# Patient Record
Sex: Male | Born: 1970 | Race: White | Hispanic: No | Marital: Married | Smoking: Current every day smoker
Health system: Southern US, Community
[De-identification: ages and names within clinical notes are randomized; demographics above are authoritative.]

## PROBLEM LIST (undated history)

## (undated) DIAGNOSIS — N433 Hydrocele, unspecified: Secondary | ICD-10-CM

## (undated) HISTORY — DX: Hydrocele, unspecified: N43.3

## (undated) HISTORY — PX: SHOULDER ARTHROSCOPY W/ SUPERIOR LABRAL ANTERIOR POSTERIOR LESION REPAIR: SHX2402

## (undated) HISTORY — PX: BICEPS TENDON REPAIR: SHX566

## (undated) HISTORY — PX: ROTATOR CUFF REPAIR: SHX139

## (undated) HISTORY — PX: SHOULDER SURGERY: SHX246

---

## 2003-01-24 ENCOUNTER — Encounter (INDEPENDENT_AMBULATORY_CARE_PROVIDER_SITE_OTHER): Payer: Self-pay | Admitting: Specialist

## 2003-01-24 ENCOUNTER — Ambulatory Visit (HOSPITAL_COMMUNITY): Admission: RE | Admit: 2003-01-24 | Discharge: 2003-01-24 | Payer: Self-pay | Admitting: General Surgery

## 2004-09-11 ENCOUNTER — Ambulatory Visit (HOSPITAL_COMMUNITY)
Admission: RE | Admit: 2004-09-11 | Discharge: 2004-09-11 | Payer: Self-pay | Admitting: Physical Medicine and Rehabilitation

## 2008-04-01 ENCOUNTER — Encounter: Admission: RE | Admit: 2008-04-01 | Discharge: 2008-04-29 | Payer: Self-pay | Admitting: Family Medicine

## 2009-08-26 ENCOUNTER — Encounter: Admission: RE | Admit: 2009-08-26 | Discharge: 2009-08-26 | Payer: Self-pay | Admitting: Family Medicine

## 2010-03-30 ENCOUNTER — Ambulatory Visit: Payer: Self-pay | Admitting: Family Medicine

## 2010-03-30 DIAGNOSIS — R222 Localized swelling, mass and lump, trunk: Secondary | ICD-10-CM

## 2010-03-30 DIAGNOSIS — N433 Hydrocele, unspecified: Secondary | ICD-10-CM

## 2010-03-30 HISTORY — DX: Hydrocele, unspecified: N43.3

## 2010-04-06 ENCOUNTER — Telehealth: Payer: Self-pay | Admitting: Family Medicine

## 2010-04-07 ENCOUNTER — Telehealth: Payer: Self-pay | Admitting: Family Medicine

## 2010-04-10 ENCOUNTER — Encounter: Payer: Self-pay | Admitting: Family Medicine

## 2010-04-17 ENCOUNTER — Telehealth (INDEPENDENT_AMBULATORY_CARE_PROVIDER_SITE_OTHER): Payer: Self-pay | Admitting: *Deleted

## 2010-05-19 ENCOUNTER — Encounter: Payer: Self-pay | Admitting: Family Medicine

## 2010-08-18 NOTE — Letter (Signed)
[facility]: [facility]   Imported By: Lanelle Bal 05/27/2010 10:42:57  _____________________________________________________________________  External Attachment:    Type:   Image     Comment:   External Document

## 2010-08-18 NOTE — Progress Notes (Signed)
Summary: urologist  Phone Note Call from Patient   Caller: Patient Call For: Nani Gasser MD Summary of Call: pt called and wanted [place] to know that he did not have a good visit with the urologist we referred him to. Pt states he did not listen to what he was saying and the doctor "got frustrated with him because his answers where too long." Pt just wanted [place] to make a note of this Initial call taken by: Avon Gully CMA, Duncan Dull),  April 17, 2010 10:05 AM

## 2010-08-18 NOTE — Consult Note (Signed)
[facility]: [facility]   Imported By: Lanelle Bal 04/20/2010 11:11:26  _____________________________________________________________________  External Attachment:    Type:   Image     Comment:   External Document

## 2010-08-18 NOTE — Progress Notes (Signed)
Summary: Pain meds  Phone Note Call from Patient   Caller: Patient [phone] Summary of Call: Pt calls back again requesting pain meds be sent until he is able to see pain doctor. Initial call taken by: Payton Spark CMA,  April 07, 2010 12:23 PM  Follow-up for Phone Call        Needs records faxed from ortho.  Follow-up by: Nani Gasser MD,  April 07, 2010 12:34 PM  Additional Follow-up for Phone Call Additional follow up Details #1::        called and spoke with pt and told him again that he needs to get ortho rec to [place]. Pt states he will "get his attorney to fax over the records to [place]".  Additional Follow-up by: Avon Gully CMA, Duncan Dull),  April 07, 2010 1:13 PM     Appended Document: Pain meds I looke him up in the registry adn called his ortho office. I will approv him for 40 tabs to last 30 days until see pain managment. We can schedule the appt for he can schedule.  September 21, 20114:32 PM Metheney MD, Santina Evans 04/09/2010 @ 8:00am- Pt notified and med faxed to pharmacy. kJ LPN  Prescriptions: Prescriptions: HYDROCODONE-IBUPROFEN 7.5-200 MG TABS (HYDROCODONE-IBUPROFEN) take one tablet three times a day  #40 x 0   Entered and Authorized by:   Nani Gasser MD   Signed by:   Nani Gasser MD on 04/08/2010   Method used:   Printed then faxed to ...       [facility]* (retail)       [address] [place], [place]  [address]       Ph: [phone]       Fax: [phone]   RxID:   [id number]

## 2010-08-18 NOTE — Assessment & Plan Note (Signed)
Summary: NOV: Lump, right hydrocele   Vital Signs:  Patient profile:   40 year old male Height:      67 inches Weight:      150 pounds BMI:     23.58 Pulse rate:   67 / minute BP sitting:   139 / 76  (right arm) Cuff size:   regular  Vitals Entered By: Avon Gully CMA, Duncan Dull) (March 30, 2010 2:55 PM) CC: NP est care   CC:  NP est care.  History of Present Illness: Has been medically released from his shoulder surgeries. Previous MD left the practice so trying ot find a new MD.  Still has chronic pain and uses 3 hydrocodone a day.    Right pea sized lump on the right abdomen.  Occ iritated based on his movement. No change in size.  Noticed it about 6 months ago.  Otherwise doesn't bother him.   As I was leaving the room he mentioned that he has water on his right testicle for several months. Says had an [place] that showed minminal fluid but that it has gotten worse since then.   Habits & Providers  Alcohol-Tobacco-Diet     Tobacco Status: current     Cigarette Packs/Day: 1.0  Exercise-Depression-Behavior     Does Patient Exercise: no     STD Risk: never     Drug Use: no     Seat Belt Use: always  Current Medications (verified): 1)  Hydrocodone-Ibuprofen 7.5-200 Mg Tabs (Hydrocodone-Ibuprofen) .... Take One Tablet Three Times A Day  Allergies (verified): 1)  Ultram (Tramadol Hcl)  Comments:  Nurse/Medical Assistant: The patient's medications and allergies were reviewed with the patient and were updated in the Medication and Allergy Lists. Avon Gully CMA, Duncan Dull) (March 30, 2010 2:57 PM)  Past History:  Past Medical History: Chronic shoulder pain  Past Surgical History: Left shoulder SLAP repair and tendon repar 01/2009 - Dr. Dorma Russell at [facility]  Labrum repair right shoulder 03/2009 - Dr. Bayard Beaver.  Mass removed from left hand LN removed from Left axilla Steroid injection into 2 discs in his back Right and left  hernia repair at age 80.     Family History: None  Social History: Married to [place] with a son.  Current Smoker Alcohol use-yes Drug use-no Regular exercise-no 4 caffeinated beverages a day.  Smoking Status:  current Packs/Day:  1.0 STD Risk:  never Drug Use:  no Seat Belt Use:  always Does Patient Exercise:  no  Review of Systems       No fever/sweats/weakness, unexplained weight loss/gain.  No vison changes.  No difficulty hearing/ringing in ears, hay fever/allergies.  No chest pain/discomfort, palpitations.  No Br lump/nipple discharge.  No cough/wheeze.  No blood in BM, nausea/vomiting/diarrhea.  No nighttime urination, leaking urine, unusual vaginal bleeding, discharge (penis or vagina).  No muscle/joint pain. No rash, change in mole.  No HA, memory loss.  No anxiety, + sleep d/o, no depression.  No easy bruising/bleeding, unexplained lump   Physical Exam  General:  Well-developed,well-nourished,in no acute distress; alert,appropriate and cooperative throughout examination Head:  Normocephalic and atraumatic without obvious abnormalities. No apparent alopecia or balding. Eyes:  No corneal or conjunctival inflammation noted. EOMI. Perrla.  Abdomen:  soft and non-tender.  Not able to palpate the lesion on her right upper abdomen.  .     Impression & Recommendations:  Problem # 1:  SWELLING, MASS, OR LUMP IN CHEST (ICD-786.6) Unable to palpate the lesion. Encouraged him to let  mke know if changing in size and not to rub at it as it could inflame the area.    Problem # 2:  HYDROCELE, RIGHT (ICD-603.9) Will refer to Urology for further evaluation.  Orders: Urology Referral (Urology)  Complete Medication List: 1)  Hydrocodone-ibuprofen 7.5-200 Mg Tabs (Hydrocodone-ibuprofen) .... Take one tablet three times a day  Contraindications/Deferment of Procedures/Staging:    Test/Procedure: FLU VAX    Reason for deferment: patient declined

## 2010-08-18 NOTE — Progress Notes (Signed)
Summary: pain meds  Phone Note Call from Patient   Caller: Patient Call For: Nani Gasser MD Summary of Call: Pt called and states  wants refill on his pain medicines until he can get in with a pain managament doc.does not have an appt set yet Initial call taken by: Avon Gully CMA, Duncan Dull),  April 06, 2010 4:35 PM  Follow-up for Phone Call        I need notes from ortho who was previously rx medications. He Can have them faxed to our office.  Follow-up by: Nani Gasser MD,  April 06, 2010 5:19 PM  Additional Follow-up for Phone Call Additional follow up Details #1::        called and left above message on pt's vm Additional Follow-up by: Avon Gully CMA, Duncan Dull),  April 07, 2010 8:58 AM

## 2010-10-23 IMAGING — US US SCROTUM
1 series · 14 of 25 positions shown · non-contrast
Comparison: None.

CLINICAL DATA: Palpable right scrotal mass.

ULTRASOUND OF SCROTUM 08/26/2009:
TECHNIQUE: Complete ultrasound examination of the testicles,
epididymis, and other scrotal structures was performed.

[Series 1: us scrotum · 0.08mm/px · 14 of 34 slices shown]
[im 1/34]
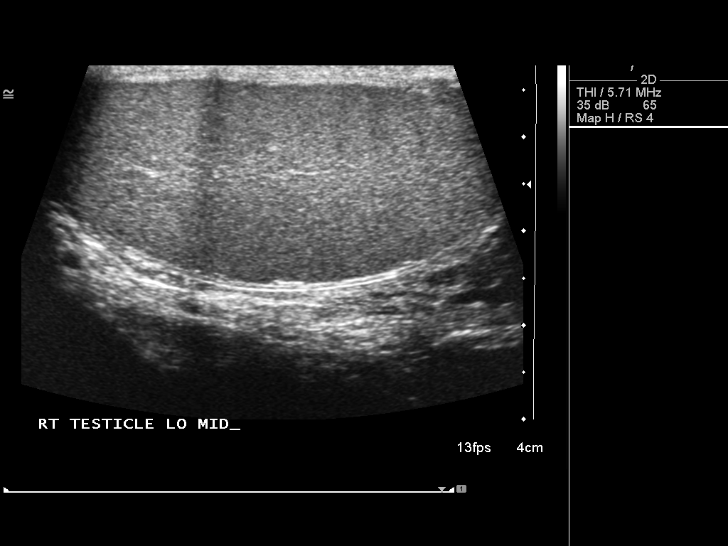
[im 3/34]
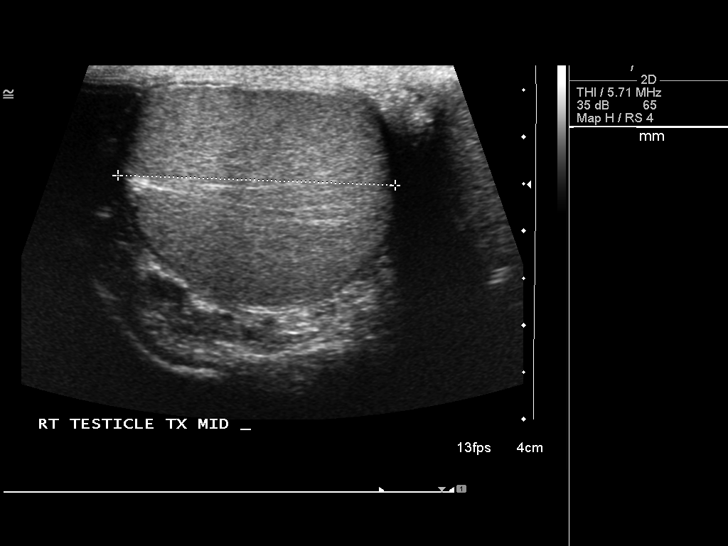
[im 6/34]
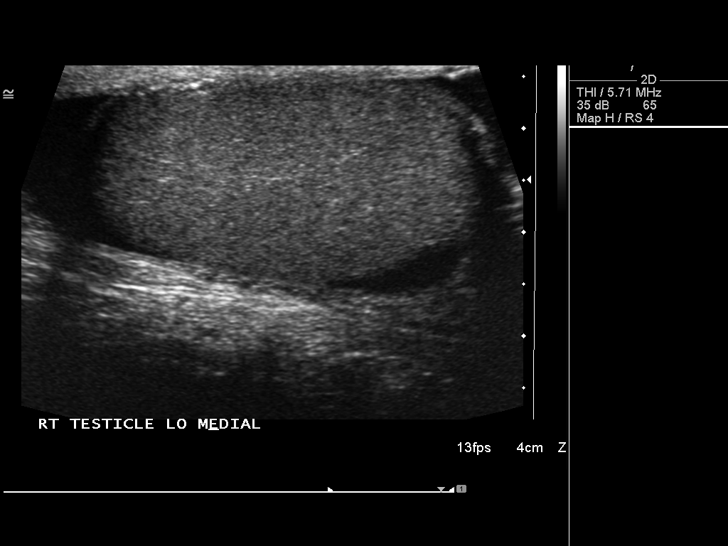
[im 9/34]
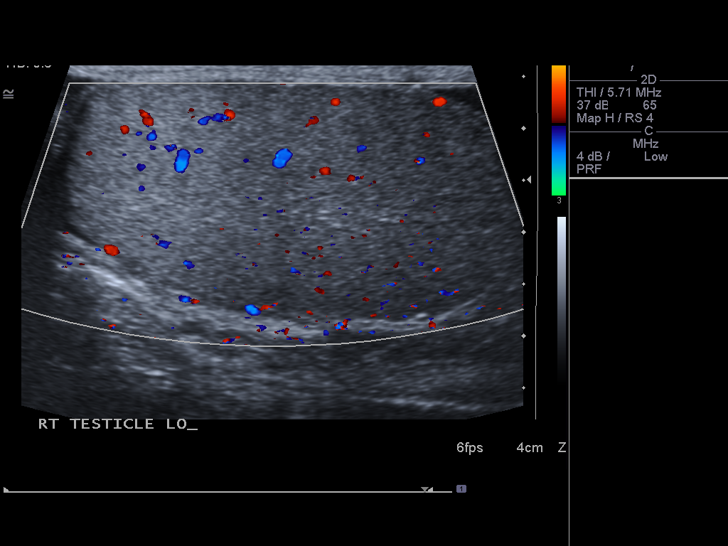
[im 12/34]
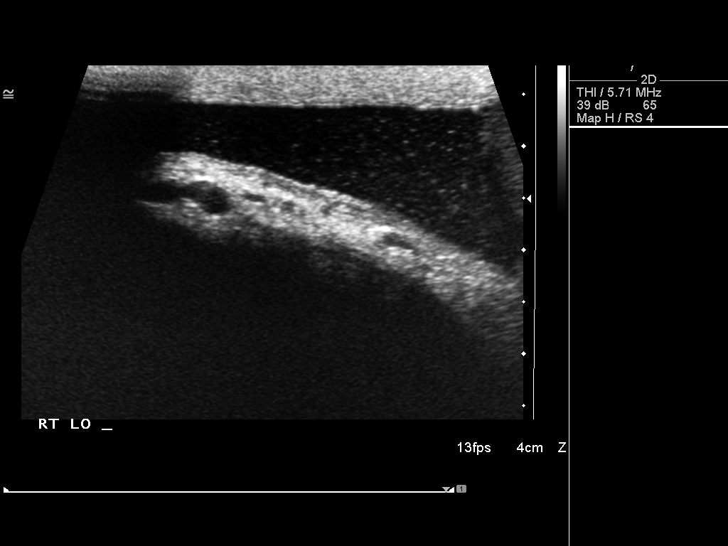
[im 13/34]
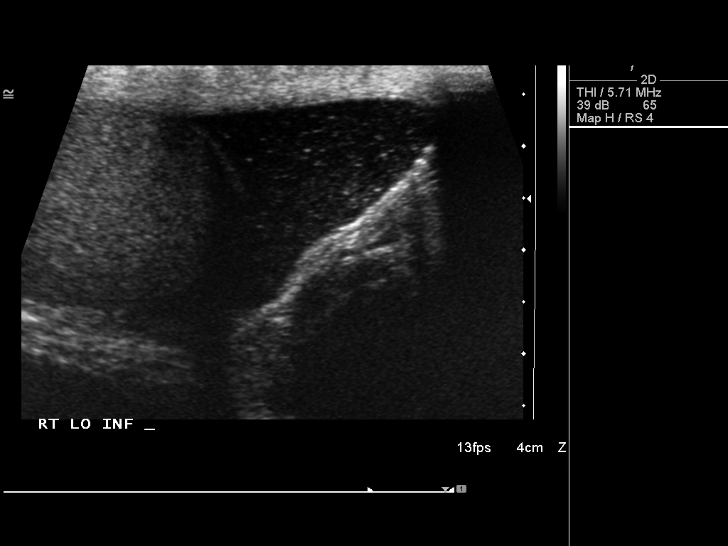
[im 16/34]
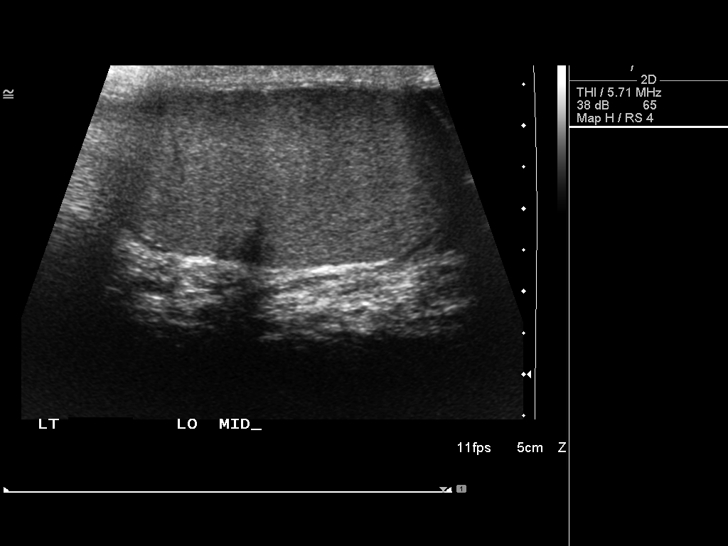
[im 18/34]
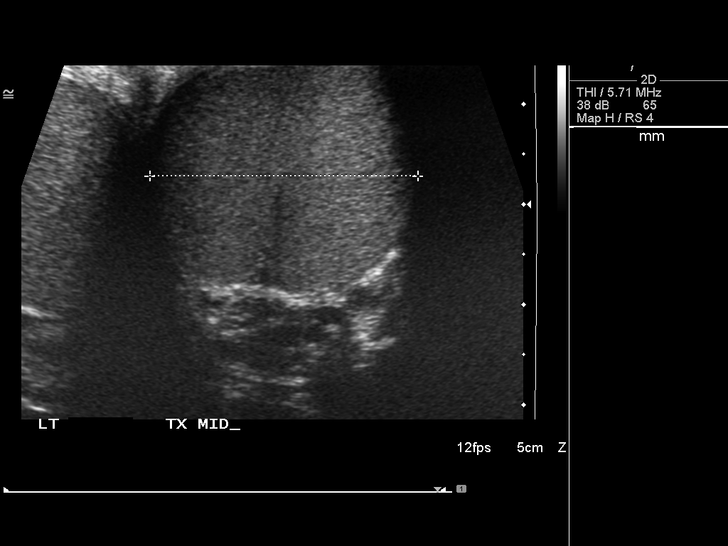
[im 21/34]
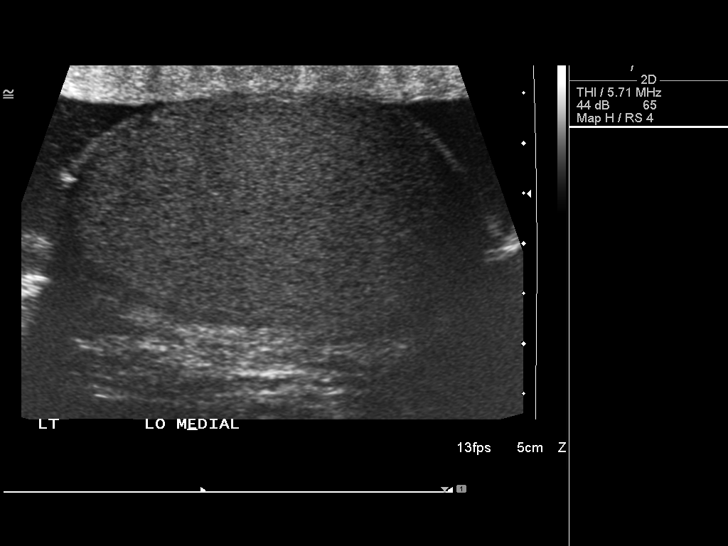
[im 23/34]
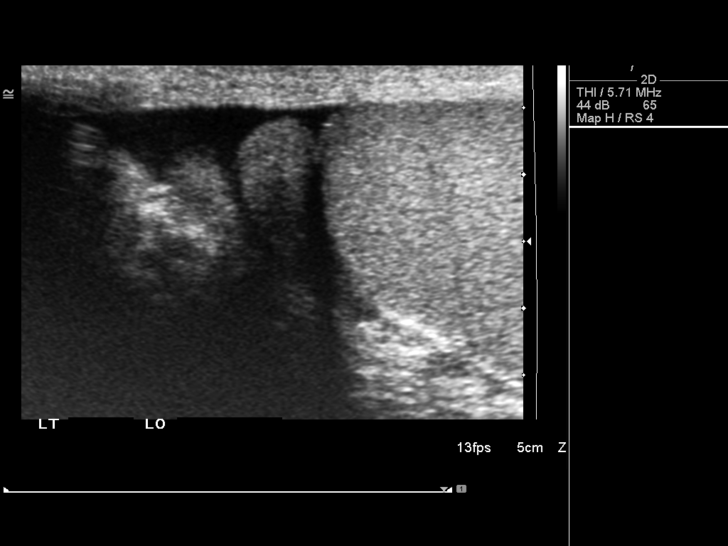
[im 25/34]
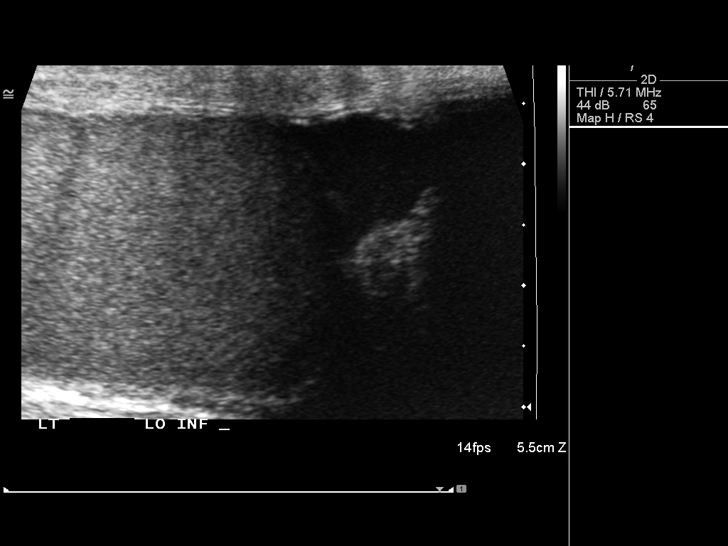
[im 28/34]
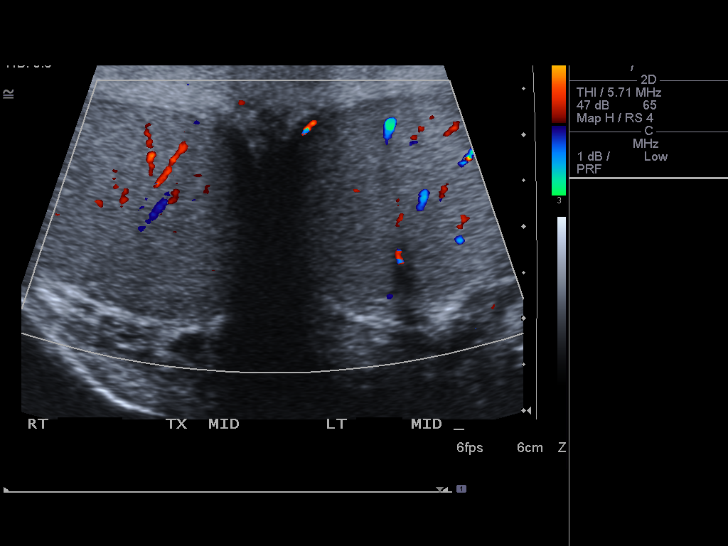
[im 31/34]
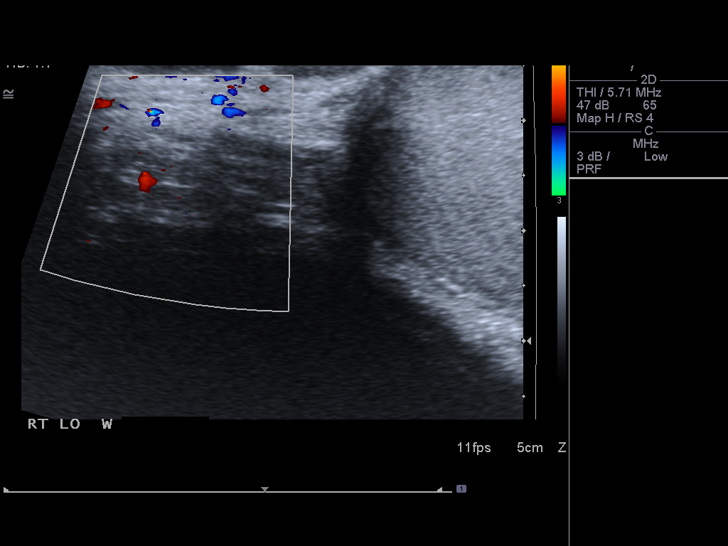
[im 34/34]
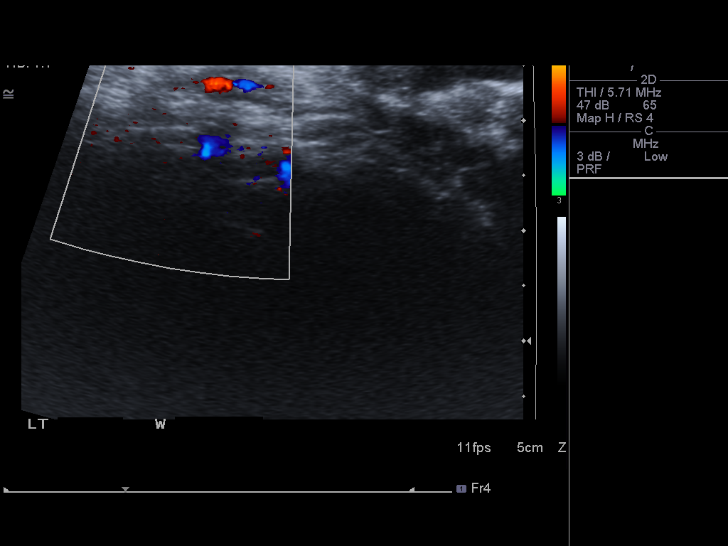

[14 of 25 positions shown; findings below may reference images not displayed]

FINDINGS: Right testicle normal in size and echotexture without
focal parenchymal abnormality, measuring approximately 4.7 x 2.2 x
3.0 cm.  Normal color Doppler signal within the right testicle.
Right epididymis normal in size and appearance without hyperemia.
Moderate sized right hydrocele.  No evidence of a right varicocele
with Valsalva.

Left testicle normal in size and echotexture without focal
parenchymal abnormality, measuring approximately 1.3 x 2.2 x
cm.  Normal color Doppler signal within the left testicle.  Left
epididymis normal in size and appearance without hyperemia.
Minimal, insignificant left hydrocele.  No evidence of a left
varicocele with Valsalva.
IMPRESSION: 1.  Moderate sized right hydrocele likely accounts for the palpable
abnormality in the right scrotum.
2.  Normal-appearing bilateral testicles and epididymi.

## 2010-12-04 NOTE — Op Note (Signed)
   NAME:  Bob Wheeler, Bob Wheeler                           ACCOUNT NO.:  [id number]   MEDICAL RECORD NO.:  [id number]                   PATIENT TYPE:  AMB   LOCATION:  DAY                                  FACILITY:  [facility]   PHYSICIAN:  Ollen Gross. Vernell Morgans, M.D.              DATE OF BIRTH:  [birth year]   DATE OF PROCEDURE:  01/24/2003  DATE OF DISCHARGE:                                 OPERATIVE REPORT   PREOPERATIVE DIAGNOSIS:  Lymphadenopathy.   POSTOPERATIVE DIAGNOSIS:  Lymphadenopathy.   PROCEDURE:  Left axillary lymph node biopsy.   SURGEON:  Ollen Gross. Carolynne Edouard, M.D.   ANESTHESIA:  General endotracheal.   PROCEDURE:  After informed consent was obtained, the patient was brought to  the operating room and placed in the supine position on the operating room  table.  After adequate induction of general anesthesia, the patient's left  axillary area was prepped with Betadine and draped in the usual sterile  manner.  The lymph node was palpable.  A small incision was made overlying  the lymph node with a 15 blade knife.  This incision was carried down  through the skin and subcutaneous tissue with the Bovie electrocautery.  Blunt dissection was then carried out in the area until the lymph node was  identified.  The lymph node was elevated with a pair of DeBakeys.  The  afferent and efferent channels were clamped with hemostats, divided, and  ligated with 3-0 silk ties, and the lymph node was about 1.5 cm in diameter  and discolored.  It appeared as though possibly his tattoo dye had been  taken up by the lymph channels and deposited in the lymph node.  The lymph  node was then removed and sent to pathology for further evaluation.  The  wound was irrigated and re-examined and found to be hemostatic.  The deep  layer of subcutaneous tissue was closed with interrupted 3-0 Vicryl stitches  and the skin was closed with interrupted 4-0 Monocryl subcuticular stitches.  A Dermabond dressing was applied.  The  patient tolerated the procedure well.  At the end of the case all needle, sponge, and instrument counts were  correct.  The patient was awakened and taken to the recovery room in stable  condition.                                               Ollen Gross. Vernell Morgans, M.D.    PST/MEDQ  D:  01/24/2003  T:  01/24/2003  Job:  [id number]

## 2010-12-08 ENCOUNTER — Ambulatory Visit (INDEPENDENT_AMBULATORY_CARE_PROVIDER_SITE_OTHER): Payer: Private Health Insurance - Indemnity | Admitting: Family Medicine

## 2010-12-08 VITALS — BP 146/84 | HR 108 | Ht 67.0 in | Wt 174.0 lb

## 2010-12-08 DIAGNOSIS — J029 Acute pharyngitis, unspecified: Secondary | ICD-10-CM

## 2010-12-08 NOTE — Progress Notes (Signed)
  Subjective:    Patient ID: Bob Wheeler, male    DOB: May 08, 1971, 40 y.o.   MRN: [id number]  HPI  Sinus pressure and low grade fever for about 2 days. The throat started hurting on the lefrt. More aggrevating at night.  No ear pain or cough. Mild nasal congestion.  No fever.  No nausea or GI symptoms. No known sick contacts. He did go out for a job interview and it was the next day he started feeling poorly.  Review of Systems     Objective:   Physical Exam  Constitutional: He is oriented to person, place, and time. He appears well-developed and well-nourished.  HENT:  Head: Normocephalic and atraumatic.  Right Ear: External ear normal.  Left Ear: External ear normal.  Nose: Nose normal.  Mouth/Throat: Oropharynx is clear and moist.       TMs and canals are clear bilaterally.   Eyes: Conjunctivae and EOM are normal. Pupils are equal, round, and reactive to light.  Neck: Neck supple. No tracheal deviation present. No thyromegaly present.  Cardiovascular: Normal rate, regular rhythm and normal heart sounds.   Pulmonary/Chest: Effort normal and breath sounds normal.  Lymphadenopathy:    He has no cervical adenopathy.  Neurological: He is alert and oriented to person, place, and time.  Skin: Skin is warm and dry.  Psychiatric: He has a normal mood and affect.          Assessment & Plan:  Pharyngitis - symptomatic care. Likely viral as his rapid strep is negative. I recommend symptomatic care with saltwater gargles, lozenges, etc. He's not better in one week he can call the office and we can consider further workup such as mono testing and possibly a CBC.

## 2010-12-09 ENCOUNTER — Telehealth: Payer: Self-pay | Admitting: Family Medicine

## 2010-12-09 NOTE — Telephone Encounter (Signed)
An accurated and up to date med list was not obtained during his OV yesterday. Can you please get a list of those meds. I will be happy to review them to see if could be elevating his BP.

## 2010-12-09 NOTE — Telephone Encounter (Signed)
Pt was seen acutely yesterday and he forgot to ask the provider about his BP and wt loss and if the medications he is on could be causing these problems.  Please advise. Plan:  Routed to Dr. Marlyne Beards, LPN [facility] Dimes

## 2010-12-10 ENCOUNTER — Telehealth: Payer: Self-pay | Admitting: Family Medicine

## 2010-12-10 NOTE — Telephone Encounter (Signed)
Pt is taking Exalgo16 mg PO QD, Hydromorphone 2mg  PO not to exceed 2 tabs in a 24 hour period prn, and amitriptyline 75 mg  PO QHS.  I will add these into the system. Plan:  Routed to Dr. Marlyne Beards, LPN Domingo Dimes

## 2010-12-11 NOTE — Telephone Encounter (Signed)
Call patient: The hydromorphone is not elevated blood pressure. Amitriptyline K. and that this is not common. Note he did have borderline blood pressure before he even started these medications. He could certainly talk to his pain management doctor and see if they've been getting elevated blood pressures at their office as well.

## 2010-12-11 NOTE — Telephone Encounter (Signed)
Pt notified that hydromorphone was not causing the problem with his low BP.  Pt informed he had low pressures before starting the hydromorphone, amitriptyline, and the exalgo.  Pt informed that he could talk with his pain mgmt provider to see if they had been getting elevated BP's.  Pt voiced understanding. Jarvis Newcomer, LPN [facility] Dimes

## 2010-12-17 NOTE — Telephone Encounter (Signed)
Closed. Zuhair Lariccia, LPN /Triage  

## 2011-08-05 ENCOUNTER — Emergency Department
Admission: EM | Admit: 2011-08-05 | Discharge: 2011-08-05 | Disposition: A | Discharge status: Home / Self Care | Payer: Private Health Insurance - Indemnity | Source: Home / Self Care

## 2011-08-05 ENCOUNTER — Encounter: Payer: Self-pay | Admitting: *Deleted

## 2011-08-05 ENCOUNTER — Emergency Department
Admit: 2011-08-05 | Discharge: 2011-08-05 | Disposition: A | Discharge status: Home / Self Care | Payer: Private Health Insurance - Indemnity | Attending: Emergency Medicine | Admitting: Emergency Medicine

## 2011-08-05 DIAGNOSIS — M79609 Pain in unspecified limb: Secondary | ICD-10-CM

## 2011-08-05 DIAGNOSIS — M79676 Pain in unspecified toe(s): Secondary | ICD-10-CM

## 2011-08-05 NOTE — ED Provider Notes (Signed)
History     CSN: [id number]  Arrival date & time 08/05/11  1413   None     No chief complaint on file.   (Consider location/radiation/quality/duration/timing/severity/associated sxs/prior treatment) HPI This patient presents today with a right great toe pain since this morning. He was taking off a slipper and got his toe caught on the floor and felt it didn't. He noticed that it swelled up a little bit and is also bruised. He is here to rule out a fracture. He has never injured that specific toe in the past other than mild sprain/stubbing his toe. He has used ice which has helped but has not used any medications.  No past medical history on file.  No past surgical history on file.  No family history on file.  History  Substance Use Topics  . Smoking status: Not on file  . Smokeless tobacco: Not on file  . Alcohol Use: Not on file      Review of Systems  Allergies  Tramadol hcl  Home Medications   Current Outpatient Rx  Name Route Sig Dispense Refill  . AMITRIPTYLINE HCL 75 MG PO TABS Oral Take 75 mg by mouth at bedtime.      Marland Kitchen HYDROMORPHONE HCL 2 MG PO TABS Oral Take 2 mg by mouth daily. Pt is not to exceed 2 tablets in a 24 hour period of this medication.     Marland Kitchen HYDROMORPHONE HCL ER 16 MG PO TB24 Oral Take 16 mg by mouth daily.        There were no vitals taken for this visit.  Physical Exam  Nursing note and vitals reviewed. Constitutional: He is oriented to person, place, and time. He appears well-developed and well-nourished.  HENT:  Head: Normocephalic and atraumatic.  Eyes: No scleral icterus.  Neck: Neck supple.  Cardiovascular: Regular rhythm and normal heart sounds.   Pulmonary/Chest: Effort normal and breath sounds normal. No respiratory distress.  Musculoskeletal:       R ankle/foot: FROM the of the foot and ankle, however the great toe range of motion is reduced secondary to pain , +TTP on the great toe and slightly on the distal first metatarsal.    No TTP medial/lateral malleolus, navicular, base of 5th, calcaneus, Achilles, proximal fibula.  There is ecchymoses at the IP joint and a palpable deformity.  Distal neurovascular status is intact.   Neurological: He is alert and oriented to person, place, and time.  Skin: Skin is warm and dry.  Psychiatric: He has a normal mood and affect. His speech is normal.    ED Course  Procedures (including critical care time)  Labs Reviewed - No data to display No results found.   No diagnosis found.    MDM   An x-ray is obtained today and is read by radiology as above. He has a fracture of the base of the distal phalanx of the right great toe that has been displaced and rotated about 90.  Because of the great toe and may require either a digital block and manual reduction versus surgical intervention, we have sent him to the orthopedist and made an appointment for him in 45 minutes. The patient going directly there. Encourage rest, ice, compression with ACE bandage and/or a brace, and elevation of injured body part. I will defer to the orthopedist for definitive care. I feel that he is safe to drive himself while wearing his normal work boots that have a firm sole.    Lily Kocher,  MD 08/05/11 1506

## 2011-08-05 NOTE — ED Notes (Signed)
Sch'ed an appt with Dr Rayburn Ma with [facility] ortho today @ 3:45pm.

## 2011-08-05 NOTE — ED Notes (Signed)
Pt c/o RT great toe injury x this AM.

## 2012-08-22 ENCOUNTER — Ambulatory Visit (INDEPENDENT_AMBULATORY_CARE_PROVIDER_SITE_OTHER): Payer: Self-pay | Admitting: Family Medicine

## 2012-08-22 ENCOUNTER — Encounter: Payer: Self-pay | Admitting: Family Medicine

## 2012-08-22 VITALS — BP 113/64 | HR 82 | Temp 98.0°F | Wt 150.0 lb

## 2012-08-22 DIAGNOSIS — J329 Chronic sinusitis, unspecified: Secondary | ICD-10-CM

## 2012-08-22 DIAGNOSIS — A499 Bacterial infection, unspecified: Secondary | ICD-10-CM

## 2012-08-22 DIAGNOSIS — B9689 Other specified bacterial agents as the cause of diseases classified elsewhere: Secondary | ICD-10-CM

## 2012-08-22 MED ORDER — SULFAMETHOXAZOLE-TRIMETHOPRIM 800-160 MG PO TABS: ORAL_TABLET | ORAL | Status: DC

## 2012-08-22 MED ORDER — AMOXICILLIN-POT CLAVULANATE 500-125 MG PO TABS: ORAL_TABLET | ORAL | Status: DC

## 2012-08-22 NOTE — Progress Notes (Signed)
CC: Bob Wheeler is a 42 y.o. male is here for Sinusitis   Subjective: HPI:  Patient presents for complaint of facial pressure. This has been present for 7 days getting worse on a daily basis. Symptoms came on gradually. Discomfort is described as moderate and localized centrally above the eyes and below the eyes. It is described as moderate to severe when leaning forward. Nothing else makes better or worse. It has been associated with nasal congestion worsening along with the pressure. It started with a sore throat but this is now resolved. Over the past 2 days he's had subjective fevers and chills which prompted today's visit. He's been treating with Alka-Seltzer without much improvement. Symptoms are present 24 hours a day but not interfering with sleep. He denies confusion, motor or sensory disturbances, cough, shortness of breath, wheezing, hearing loss, ear pain, eye complaints.   Review Of Systems Outlined In HPI  Past Medical History  Diagnosis Date  . HYDROCELE, RIGHT 03/30/2010    Qualifier: Diagnosis of  By: Linford Arnold MD, Santina Evans       No family history on file.  reviewed with patient, no pertinent past family medical history.  History  Substance Use Topics  . Smoking status: Current Every Day Smoker -- 1.0 packs/day  . Smokeless tobacco: Not on file  . Alcohol Use: Yes     Comment: 12 drinks per wk     Objective: Filed Vitals:   08/22/12 1405  BP: 113/64  Pulse: 82  Temp: 98 F (36.7 C)    General: Alert and Oriented, No Acute Distress HEENT: Pupils equal, round, reactive to light. Conjunctivae clear.  External ears unremarkable, canals clear with intact TMs with appropriate landmarks.  Middle ear appears open without effusion. Erythematous and boggy inferior turbinates.  Moist mucous membranes, pharynx without inflammation nor lesions.  Neck supple without palpable lymphadenopathy nor abnormal masses. Frontal sinus tenderness to percussion Lungs: Clear to  auscultation bilaterally, no wheezing/ronchi/rales.  Comfortable work of breathing. Good air movement. Cardiac: Regular rate and rhythm. Normal S1/S2.  No murmurs, rubs, nor gallops.   Extremities: No peripheral edema.  Strong peripheral pulses.  Mental Status: No depression, anxiety, nor agitation. Skin: Warm and dry.  Assessment & Plan: Abad was seen today for sinusitis.  Diagnoses and associated orders for this visit:  Bacterial sinusitis - amoxicillin-clavulanate (AUGMENTIN) 500-125 MG per tablet; Take one by mouth every 8 hours for ten total days. - sulfamethoxazole-trimethoprim (SEPTRA DS) 800-160 MG per tablet; One by mouth twice a day for ten days.    Discussed diagnosis of bacterial sinusitis, continue Alka-Seltzer consider nasal saline washes. Patient has to pay for medications out of pocket therefore was encouraged to pay for Augmentin if financially reasonable otherwise second line cheaper alternative would be Septra.Signs and symptoms requring emergent/urgent reevaluation were discussed with the patient.  Return if symptoms worsen or fail to improve.

## 2012-08-24 ENCOUNTER — Telehealth: Payer: Self-pay | Admitting: *Deleted

## 2012-08-24 MED ORDER — AMOXICILLIN 500 MG PO TABS: 1000.0000 mg | ORAL_TABLET | Freq: Three times a day (TID) | ORAL | Status: DC

## 2012-08-24 NOTE — Telephone Encounter (Signed)
Rash on waist area and arms since started the antibiotic.Please send different med to pharmacy

## 2012-08-24 NOTE — Telephone Encounter (Signed)
Patient wanted to know if you would

## 2012-08-24 NOTE — Telephone Encounter (Signed)
Pt calls and wants to know if he can get a note to be out of work for the rest of the week because he states he still is not feeling well at all

## 2012-08-24 NOTE — Telephone Encounter (Signed)
Patient was taking bactrim, augmentin too expensive, will try high dose amox.

## 2012-08-25 NOTE — Telephone Encounter (Signed)
Sue Lush, Placed in you inbox ready to be sent/picked up.

## 2012-10-01 IMAGING — CR DG TOE GREAT 2+V*R*
2 series · 2 of 2 positions shown · non-contrast
Comparison: None.

CLINICAL DATA: Right great toe pain, injured today

RIGHT GREAT TOE

[view not recorded (1 of 2)]
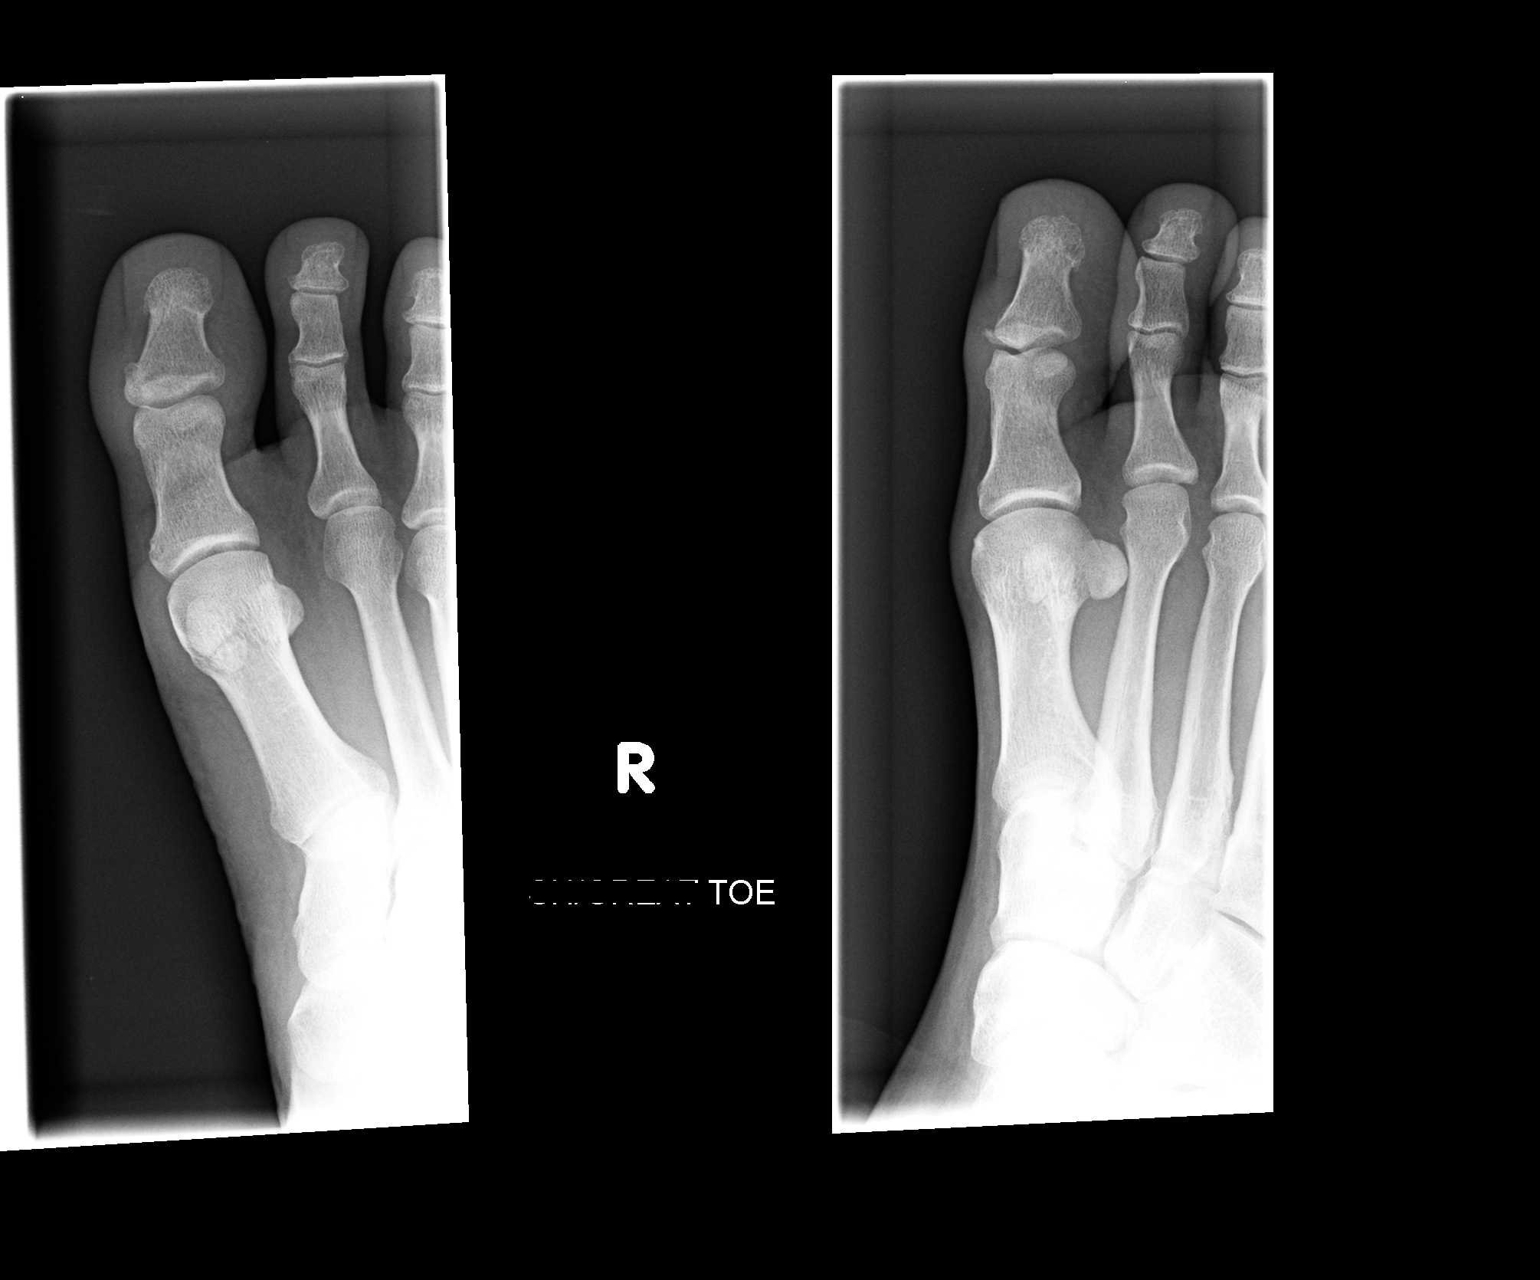

[view not recorded (2 of 2)]
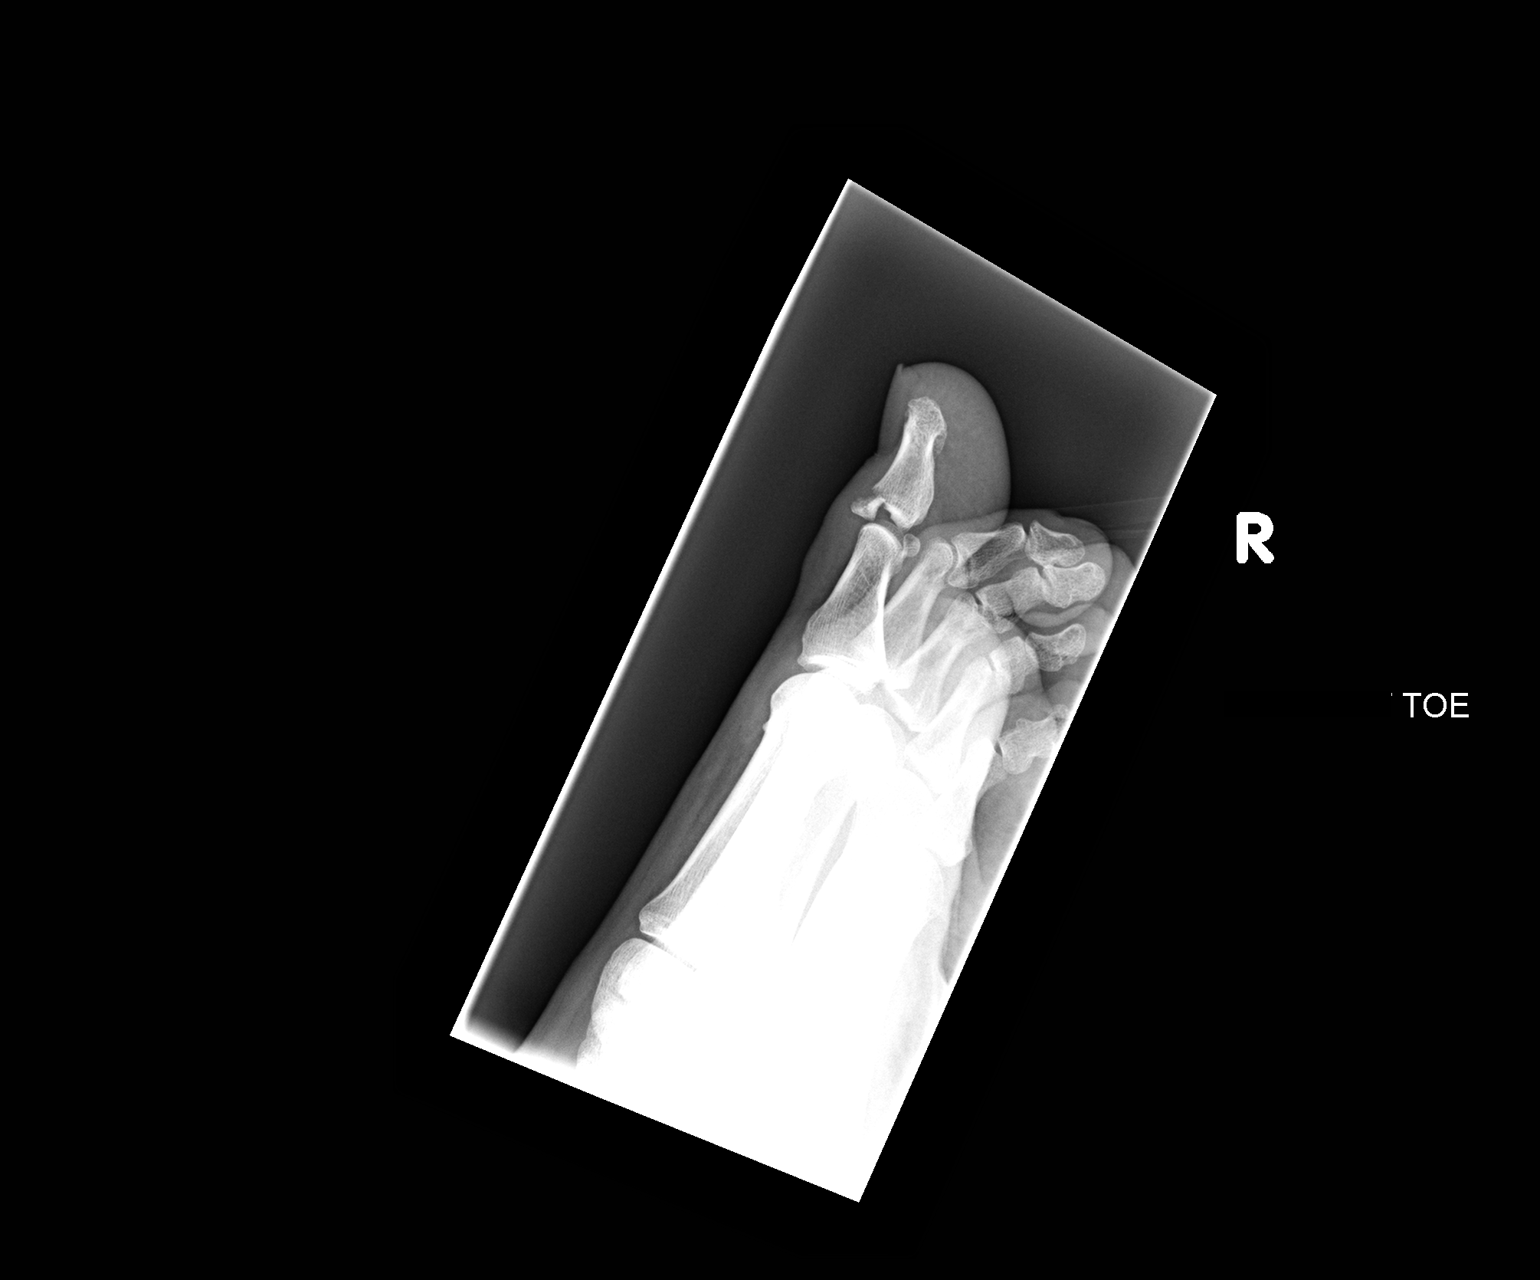

[2 of 2 positions shown; findings below may reference images not displayed]

FINDINGS: There is an avulsion fracture fragment from the base of
the distal phalanx of the right great toe on the dorsal aspect
which appears displaced and rotated.  There is soft tissue swelling
present.  No other acute abnormality is seen.  Joint spaces appear
normal.
IMPRESSION: Displaced and rotated avulsion fracture fragment from the base of
the distal phalanx of the right great toe dorsally.

## 2013-02-10 DIAGNOSIS — Z0000 Encounter for general adult medical examination without abnormal findings: Secondary | ICD-10-CM | POA: Insufficient documentation

## 2013-05-04 ENCOUNTER — Ambulatory Visit (INDEPENDENT_AMBULATORY_CARE_PROVIDER_SITE_OTHER): Payer: Managed Care, Other (non HMO) | Admitting: Family Medicine

## 2013-05-04 ENCOUNTER — Encounter: Payer: Self-pay | Admitting: Family Medicine

## 2013-05-04 VITALS — BP 124/77 | HR 75 | Ht 67.0 in | Wt 160.0 lb

## 2013-05-04 DIAGNOSIS — Z0000 Encounter for general adult medical examination without abnormal findings: Secondary | ICD-10-CM

## 2013-05-04 NOTE — Progress Notes (Signed)
Subjective:    Patient ID: Bob Wheeler, male    DOB: [birth year], 42 y.o.   MRN: [id number]  HPI Here for CPE today.  Had 2 cups of black coffee. No specific complains.  Not sue about last Tdap.    Review of Systems Comprehensive review of systems is negative.  BP 124/77  Pulse 75  Ht 5\' 7"  (1.702 m)  Wt 160 lb (72.576 kg)  BMI 25.05 kg/m2    Allergies  Allergen Reactions  . Sulfa Antibiotics     Rash   . Tramadol Hcl     REACTION: vomiting  . Tylenol [Acetaminophen]     Past Medical History  Diagnosis Date  . HYDROCELE, RIGHT 03/30/2010    Qualifier: Diagnosis of  By: Linford Arnold MD, Santina Evans      Past Surgical History  Procedure Laterality Date  . Shoulder surgery    . Shoulder arthroscopy w/ superior labral anterior posterior lesion repair    . Biceps tendon repair    . Rotator cuff repair      History   Social History  . Marital Status: Married    Spouse Name: N/A    Number of Children: 1  . Years of Education: N/A   Occupational History  . Product/process development scientist.     Social History Main Topics  . Smoking status: Current Every Day Smoker    Last Attempt to Quit: 07/01/2012  . Smokeless tobacco: Not on file     Comment: pt smokes e-cigs (8-12)  . Alcohol Use: 8.4 oz/week    14 Cans of beer per week     Comment: 12 drinks per wk  . Drug Use: No  . Sexual Activity: Not on file   Other Topics Concern  . Not on file   Social History Narrative   Physically active job. 2-3 cups coffee per day.     Family History  Problem Relation Age of Onset  . Lung cancer Father     smoker  . Diabetes Mellitus II Maternal Grandmother   . Breast cancer Maternal Grandmother     Outpatient Encounter Prescriptions as of 05/04/2013  Medication Sig Dispense Refill  . [DISCONTINUED] amoxicillin (AMOXIL) 500 MG tablet Take 2 tablets (1,000 mg total) by mouth 3 (three) times daily.  54 tablet  0   No facility-administered encounter medications on file as of 05/04/2013.            Objective:   Physical Exam  Constitutional: He is oriented to person, place, and time. He appears well-developed and well-nourished.  HENT:  Head: Normocephalic and atraumatic.  Right Ear: External ear normal.  Left Ear: External ear normal.  Nose: Nose normal.  Mouth/Throat: Oropharynx is clear and moist.  Eyes: Conjunctivae and EOM are normal. Pupils are equal, round, and reactive to light.  Neck: Normal range of motion. Neck supple. No thyromegaly present.  Cardiovascular: Normal rate, regular rhythm, normal heart sounds and intact distal pulses.   No carotid bruit   Pulmonary/Chest: Effort normal and breath sounds normal.  Abdominal: Soft. Bowel sounds are normal. He exhibits no distension and no mass. There is no tenderness. There is no rebound and no guarding.  Musculoskeletal: Normal range of motion.  Lymphadenopathy:    He has no cervical adenopathy.  Neurological: He is alert and oriented to person, place, and time. He has normal reflexes.  Skin: Skin is warm and dry.  Psychiatric: He has a normal mood and affect. His behavior is normal. Judgment and thought  content normal.          Assessment & Plan:  CPE-  Keep up a regular exercise program and make sure you are eating a healthy diet Try to eat 4 servings of dairy a day, or if you are lactose intolerant take a calcium with vitamin D daily.  Your vaccines are up to date.  Tdap given today. CMP and lipids today.   Declines flu vaccine.

## 2013-05-04 NOTE — Patient Instructions (Addendum)
Keep up a regular exercise program and make sure you are eating a healthy diet Try to eat 4 servings of dairy a day, or if you are lactose intolerant take a calcium with vitamin D daily.  Your vaccines are up to date.   

## 2013-05-05 LAB — COMPLETE METABOLIC PANEL WITH GFR
ALT: 22 U/L (ref 0–53)
Albumin: 4.7 g/dL (ref 3.5–5.2)
Alkaline Phosphatase: 49 U/L (ref 39–117)
GFR, Est Non African American: >89 mL/min
Glucose, Bld: 87 mg/dL (ref 70–99)
Potassium: 4.2 mEq/L (ref 3.5–5.3)
Sodium: 139 mEq/L (ref 135–145)

## 2013-05-05 LAB — LIPID PANEL: Triglycerides: 50 mg/dL (ref <150)

## 2022-06-18 DIAGNOSIS — Z419 Encounter for procedure for purposes other than remedying health state, unspecified: Secondary | ICD-10-CM | POA: Diagnosis not present

## 2022-07-19 DIAGNOSIS — Z419 Encounter for procedure for purposes other than remedying health state, unspecified: Secondary | ICD-10-CM | POA: Diagnosis not present

## 2022-08-19 DIAGNOSIS — Z419 Encounter for procedure for purposes other than remedying health state, unspecified: Secondary | ICD-10-CM | POA: Diagnosis not present

## 2022-09-10 ENCOUNTER — Telehealth: Payer: Self-pay | Admitting: Family Medicine

## 2022-09-10 NOTE — Telephone Encounter (Signed)
Pt called requesting to reestablish care. Pt has not been seen since 2014. Is that okay?

## 2022-09-13 NOTE — Telephone Encounter (Signed)
Ok to schedule.

## 2022-09-17 DIAGNOSIS — Z419 Encounter for procedure for purposes other than remedying health state, unspecified: Secondary | ICD-10-CM | POA: Diagnosis not present

## 2022-10-14 ENCOUNTER — Ambulatory Visit (INDEPENDENT_AMBULATORY_CARE_PROVIDER_SITE_OTHER): Payer: Medicaid Other | Admitting: Family Medicine

## 2022-10-14 ENCOUNTER — Encounter: Payer: Self-pay | Admitting: Family Medicine

## 2022-10-14 VITALS — BP 117/75 | HR 87 | Ht 67.0 in | Wt 160.0 lb

## 2022-10-14 DIAGNOSIS — E785 Hyperlipidemia, unspecified: Secondary | ICD-10-CM

## 2022-10-14 DIAGNOSIS — S069XAA Unspecified intracranial injury with loss of consciousness status unknown, initial encounter: Secondary | ICD-10-CM | POA: Insufficient documentation

## 2022-10-14 DIAGNOSIS — Z1211 Encounter for screening for malignant neoplasm of colon: Secondary | ICD-10-CM

## 2022-10-14 DIAGNOSIS — Z125 Encounter for screening for malignant neoplasm of prostate: Secondary | ICD-10-CM

## 2022-10-14 DIAGNOSIS — S069X9S Unspecified intracranial injury with loss of consciousness of unspecified duration, sequela: Secondary | ICD-10-CM

## 2022-10-14 DIAGNOSIS — Z720 Tobacco use: Secondary | ICD-10-CM | POA: Insufficient documentation

## 2022-10-14 NOTE — Assessment & Plan Note (Signed)
To review to evaluate lipids.  Last time they were checked was about 10 years ago and his LDL was elevated at that time.  Now that he is over 43 I would like to best better with stratify his cardiovascular risk.

## 2022-10-14 NOTE — Assessment & Plan Note (Signed)
Follows with neurology for chronic headaches and persistent neurologic symptoms.

## 2022-10-14 NOTE — Progress Notes (Signed)
New Patient Office Visit  Subjective    Patient ID: Bob Wheeler, male    DOB: 28-Feb-1971  Age: 52 y.o. MRN: [id number]  CC:  Chief Complaint  Patient presents with   Establish Care    HPI Bob Wheeler presents to re-establish care.  I last saw him about 10 years ago he ended up moving out of the area to [place] for a while.  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  It caused major head trauma and actually injured his scalp.  Since then he has had a lot of problems with dizziness, light and sound sensitivity, frequent migraines and headaches.  He has daily chronic pressure across his forehead.  He does follow with Dr. Alonna Minium, neurology, in [place].  He does smoke alcohol and drink beer daily.  Not had lab work and probably a most 10 years.  He has not had colon cancer screening or prostate screening  Outpatient Encounter Medications as of 10/14/2022  Medication Sig   amitriptyline (ELAVIL) 10 MG tablet Take 10 mg by mouth at bedtime.   diclofenac (VOLTAREN) 75 MG EC tablet Take 75 mg by mouth 2 (two) times daily.   levETIRAcetam (KEPPRA) 1000 MG tablet Take 1,000 mg by mouth 2 (two) times daily.   rizatriptan (MAXALT-MLT) 10 MG disintegrating tablet Take 10-20 mg by mouth daily as needed for migraine.   No facility-administered encounter medications on file as of 10/14/2022.    Past Medical History:  Diagnosis Date   HYDROCELE, RIGHT 03/30/2010   Qualifier: Diagnosis of  By: Madilyn Fireman MD, Barnetta Chapel      Past Surgical History:  Procedure Laterality Date   BICEPS TENDON REPAIR     ROTATOR CUFF REPAIR     SHOULDER ARTHROSCOPY W/ SUPERIOR LABRAL ANTERIOR POSTERIOR LESION REPAIR     SHOULDER SURGERY      Family History  Problem Relation Age of Onset   Lung cancer Father        smoker   Diabetes Mellitus II Maternal Grandmother    Breast cancer Maternal Grandmother     Social History   Socioeconomic  History   Marital status: Married    Spouse name: Not on file   Number of children: 1   Years of education: Not on file   Highest education level: Not on file  Occupational History   Occupation: Tour manager.   Tobacco Use   Smoking status: Every Day    Types: Cigarettes    Last attempt to quit: 07/01/2012    Years since quitting: 10.2   Smokeless tobacco: Not on file   Tobacco comments:    pt smokes e-cigs (8-12)  Substance and Sexual Activity   Alcohol use: Yes    Alcohol/week: 14.0 standard drinks of alcohol    Types: 14 Cans of beer per week    Comment: 12 drinks per wk   Drug use: No   Sexual activity: Not on file  Other Topics Concern   Not on file  Social History Narrative   Physically active job. 2-3 cups coffee per day.    Social Determinants of Health   Financial Resource Strain: Not on file  Food Insecurity: Not on file  Transportation Needs: Not on file  Physical Activity: Not on file  Stress: Not on file  Social Connections: Not on file  Intimate Partner Violence: Not on file    ROS      Objective  BP 117/75   Pulse 87   Ht 5\' 7"  (1.702 m)   Wt 160 lb (72.6 kg)   SpO2 97%   BMI 25.06 kg/m   Physical Exam Constitutional:      Appearance: He is well-developed.  HENT:     Head: Normocephalic and atraumatic.  Cardiovascular:     Rate and Rhythm: Normal rate and regular rhythm.     Heart sounds: Normal heart sounds.  Pulmonary:     Effort: Pulmonary effort is normal.     Breath sounds: Normal breath sounds.  Skin:    General: Skin is warm and dry.  Neurological:     Mental Status: He is alert and oriented to person, place, and time.  Psychiatric:        Behavior: Behavior normal.         Assessment & Plan:   Problem List Items Addressed This Visit       Nervous and Auditory   TBI (traumatic brain injury) ([place]) - Primary    Follows with neurology for chronic headaches and persistent neurologic symptoms.      Relevant  Orders   PSA   Lipid panel   COMPLETE METABOLIC PANEL WITH GFR   CBC     Other   Tobacco abuse   Hyperlipidemia    To review to evaluate lipids.  Last time they were checked was about 10 years ago and his LDL was elevated at that time.  Now that he is over 41 I would like to best better with stratify his cardiovascular risk.      Relevant Orders   PSA   Lipid panel   COMPLETE METABOLIC PANEL WITH GFR   CBC   Other Visit Diagnoses     Screening for prostate cancer       Relevant Orders   PSA   Lipid panel   COMPLETE METABOLIC PANEL WITH GFR   CBC   Colon cancer screening       Relevant Orders   Cologuard      Discussed colon cancer screening options.  He is interested in Cologuard.  Return in about 1 year (around 10/14/2023) for Wellness Exam.   Beatrice Lecher, MD

## 2022-10-18 DIAGNOSIS — Z419 Encounter for procedure for purposes other than remedying health state, unspecified: Secondary | ICD-10-CM | POA: Diagnosis not present

## 2022-10-19 DIAGNOSIS — E785 Hyperlipidemia, unspecified: Secondary | ICD-10-CM | POA: Diagnosis not present

## 2022-10-19 DIAGNOSIS — Z125 Encounter for screening for malignant neoplasm of prostate: Secondary | ICD-10-CM | POA: Diagnosis not present

## 2022-10-19 DIAGNOSIS — S069X9S Unspecified intracranial injury with loss of consciousness of unspecified duration, sequela: Secondary | ICD-10-CM | POA: Diagnosis not present

## 2022-10-19 LAB — PSA: PSA: 5.65 ng/mL — ABNORMAL HIGH (ref <=4.00)

## 2022-10-19 LAB — CBC
Hemoglobin: 16.4 g/dL (ref 13.2–17.1)
MCH: 30.2 pg (ref 27.0–33.0)
MCHC: 34.1 g/dL (ref 32.0–36.0)
MCV: 88.6 fL (ref 80.0–100.0)

## 2022-10-20 LAB — COMPLETE METABOLIC PANEL WITH GFR
AG Ratio: 1.9 (calc) (ref 1.0–2.5)
ALT: 16 U/L (ref 9–46)
AST: 22 U/L (ref 10–35)
Albumin: 4.6 g/dL (ref 3.6–5.1)
Alkaline phosphatase (APISO): 54 U/L (ref 35–144)
BUN: 7 mg/dL (ref 7–25)
CO2: 23 mmol/L (ref 20–32)
Calcium: 9.7 mg/dL (ref 8.6–10.3)
Chloride: 104 mmol/L (ref 98–110)
Creat: 0.81 mg/dL (ref 0.70–1.30)
Globulin: 2.4 g/dL (calc) (ref 1.9–3.7)
Glucose, Bld: 104 mg/dL — ABNORMAL HIGH (ref 65–99)
Potassium: 4.4 mmol/L (ref 3.5–5.3)
Sodium: 138 mmol/L (ref 135–146)
Total Bilirubin: 0.6 mg/dL (ref 0.2–1.2)
Total Protein: 7 g/dL (ref 6.1–8.1)
eGFR: 107 mL/min/{1.73_m2} (ref >=60)

## 2022-10-20 LAB — CBC
HCT: 48.1 % (ref 38.5–50.0)
MPV: 10.7 fL (ref 7.5–12.5)
Platelets: 251 10*3/uL (ref 140–400)
RBC: 5.43 10*6/uL (ref 4.20–5.80)
RDW: 12.9 % (ref 11.0–15.0)
WBC: 5.9 10*3/uL (ref 3.8–10.8)

## 2022-10-20 LAB — LIPID PANEL
Cholesterol: 193 mg/dL (ref <200)
HDL: 86 mg/dL (ref >=40)
LDL Cholesterol (Calc): 93 mg/dL (calc)
Non-HDL Cholesterol (Calc): 107 mg/dL (calc) (ref <130)
Total CHOL/HDL Ratio: 2.2 (calc) (ref <5.0)
Triglycerides: 55 mg/dL (ref <150)

## 2022-10-21 NOTE — Progress Notes (Signed)
Call patient: Metabolic panel looks okay except glucose was just borderline elevated week.  When you are here next time we will do an A1c to screen for prediabetes/diabetes.  Cholesterol is at goal.  Blood count is normal.  Prostate test was mildly elevated at 5.6.  I do not have any old PSAs for comparison so I would like to recheck your level in about 4 months.

## 2022-11-15 DIAGNOSIS — G510 Bell's palsy: Secondary | ICD-10-CM | POA: Diagnosis not present

## 2022-11-15 DIAGNOSIS — Z87820 Personal history of traumatic brain injury: Secondary | ICD-10-CM | POA: Diagnosis not present

## 2022-11-15 DIAGNOSIS — R220 Localized swelling, mass and lump, head: Secondary | ICD-10-CM | POA: Diagnosis not present

## 2022-11-15 DIAGNOSIS — Z87891 Personal history of nicotine dependence: Secondary | ICD-10-CM | POA: Diagnosis not present

## 2022-11-16 ENCOUNTER — Telehealth: Payer: Self-pay | Admitting: General Practice

## 2022-11-16 NOTE — Transitions of Care (Post Inpatient/ED Visit) (Signed)
   11/16/2022  Name: Bob Wheeler MRN: [id number] DOB: [birth year]  Today's TOC FU Call Status: Today's TOC FU Call Status:: Successful TOC FU Call Competed TOC FU Call Complete Date: 11/16/22  Transition Care Management Follow-up Telephone Call Date of Discharge: 11/15/22 Discharge Facility: Other ([facility]) Name of Other ([facility]) Discharge Facility: [facility] Type of Discharge: Emergency Department Reason for ED Visit: Neurologic Neurologic Diagnosis:  (bells palsy) How have you been since you were released from the hospital?: Same Any questions or concerns?: No  Items Reviewed: Did you receive and understand the discharge instructions provided?: Yes Medications obtained and verified?: Yes (Medications Reviewed) Any new allergies since your discharge?: No Dietary orders reviewed?: NA Do you have support at home?: Yes  Home Care and Equipment/Supplies: Were Home Health Services Ordered?: NA Any new equipment or medical supplies ordered?: NA  Functional Questionnaire: Do you need assistance with bathing/showering or dressing?: No Do you need assistance with meal preparation?: No Do you need assistance with eating?: No Do you have difficulty maintaining continence: No Do you need assistance with getting out of bed/getting out of a chair/moving?: No Do you have difficulty managing or taking your medications?: No  Follow up appointments reviewed: PCP Follow-up appointment confirmed?: Yes Date of PCP follow-up appointment?: 11/17/22 Follow-up Provider: Dr. Linford Arnold Specialist [facility] Follow-up appointment confirmed?: NA Do you need transportation to your follow-up appointment?: No Do you understand care options if your condition(s) worsen?: Yes-patient verbalized understanding    SIGNATURE: Modesto Charon, RN BSN

## 2022-11-17 ENCOUNTER — Ambulatory Visit (INDEPENDENT_AMBULATORY_CARE_PROVIDER_SITE_OTHER): Payer: Medicaid Other | Admitting: Family Medicine

## 2022-11-17 ENCOUNTER — Encounter: Payer: Self-pay | Admitting: Family Medicine

## 2022-11-17 VITALS — BP 126/82 | HR 77 | Ht 67.0 in | Wt 162.0 lb

## 2022-11-17 DIAGNOSIS — R7309 Other abnormal glucose: Secondary | ICD-10-CM | POA: Diagnosis not present

## 2022-11-17 DIAGNOSIS — G510 Bell's palsy: Secondary | ICD-10-CM

## 2022-11-17 DIAGNOSIS — Z419 Encounter for procedure for purposes other than remedying health state, unspecified: Secondary | ICD-10-CM | POA: Diagnosis not present

## 2022-11-17 LAB — POCT GLYCOSYLATED HEMOGLOBIN (HGB A1C): Hemoglobin A1C: 5 % (ref 4.0–5.6)

## 2022-11-17 NOTE — Progress Notes (Signed)
Established Patient Office Visit  Subjective   Patient ID: Bob Wheeler, male    DOB: [birth year]  Age: 52 y.o. MRN: [id number]  Chief Complaint  Patient presents with   Follow-up    Bells palsy Pt states that the L side of his mouth feels like it has been numbed up with Novocain. He stated that about 1 week prior to him being seen in the ED he experienced a different type of dizziness than what he normally does. He said that it was more like "sound waves" he said it felt like when he would take a step the ground would move.      HPI  He was seen in the emergency department on April 29 for right-sided facial weakness.  He says the day before he had noticed that while he was trying to drink his drink that some of it was dribbling down the corner of his mouth and he thought it was a little odd.  A friend had actually noted that he was slurring a little bit.  No fevers or chills or cold symptoms.  And by the next day he realized that the entire right half of his face was not working.  He called here on Monday and we recommended that he go to the ED for further evaluation. Dx with Bells palsy and placed on prednisone.    Pt states that the L side of his mouth feels like it has been numbed up with Novocain. He stated that about 1 week prior to him being seen in the ED he experienced a different type of dizziness than what he normally does. He said that it was more like "sound waves" he said it felt like when he would take a step the ground would move.      ROS    Objective:     BP 126/82   Pulse 77   Ht 5\' 7"  (1.702 m)   Wt 162 lb (73.5 kg)   SpO2 97%   BMI 25.37 kg/m    Physical Exam Vitals reviewed.  Constitutional:      Appearance: Normal appearance. He is well-developed.  HENT:     Head: Normocephalic and atraumatic.  Eyes:     Conjunctiva/sclera: Conjunctivae normal.  Cardiovascular:     Rate and Rhythm: Normal rate.  Pulmonary:     Effort: Pulmonary effort is normal.   Skin:    General: Skin is dry.     Coloration: Skin is not pale.  Neurological:     General: No focal deficit present.     Mental Status: He is alert and oriented to person, place, and time.     Comments: Unable to raise his right eyebrow or pinch his right eye closed.  Unable to lift the corner of his lips into a smile on the right side.  Psychiatric:        Behavior: Behavior normal.      Results for orders placed or performed in visit on 11/17/22  POCT glycosylated hemoglobin (Hb A1C)  Result Value Ref Range   Hemoglobin A1C 5.0 4.0 - 5.6 %   HbA1c POC (<> result, manual entry)     HbA1c, POC (prediabetic range)     HbA1c, POC (controlled diabetic range)        The 10-year ASCVD risk score (Arnett DK, et al., 2019) is: 4.4%    Assessment & Plan:   Problem List Items Addressed This Visit   None Visit Diagnoses  Abnormal glucose level    -  Primary   Relevant Orders   POCT glycosylated hemoglobin (Hb A1C) (Completed)   Bell's palsy           Palsy-we discussed care for his eye he already is using moisturizing drops also discussed that he can use an ointment at night or tape the eyelid closed.  He unfortunately has not had any improvement in his symptoms yet and we discussed that sometimes this can take days, 2 weeks, to months to improve.  He does feel like there is a little bit of numbness on the left corner of his mouth.  And he is still having some difficulty with drooling a little bit  Glucose-we are also following up his previous lab levels.  We did do an A1c today for further evaluation.  A1c was 5.0 which is perfect.  Blood pressure also looks great today it was elevated during the ED visit.  No follow-ups on file.    Nani Gasser, MD

## 2022-12-18 DIAGNOSIS — Z419 Encounter for procedure for purposes other than remedying health state, unspecified: Secondary | ICD-10-CM | POA: Diagnosis not present

## 2023-01-17 DIAGNOSIS — Z419 Encounter for procedure for purposes other than remedying health state, unspecified: Secondary | ICD-10-CM | POA: Diagnosis not present

## 2023-02-17 DIAGNOSIS — Z419 Encounter for procedure for purposes other than remedying health state, unspecified: Secondary | ICD-10-CM | POA: Diagnosis not present

## 2023-03-20 DIAGNOSIS — Z419 Encounter for procedure for purposes other than remedying health state, unspecified: Secondary | ICD-10-CM | POA: Diagnosis not present

## 2023-03-29 ENCOUNTER — Ambulatory Visit (INDEPENDENT_AMBULATORY_CARE_PROVIDER_SITE_OTHER): Payer: Medicaid Other | Admitting: Family Medicine

## 2023-03-29 ENCOUNTER — Encounter: Payer: Self-pay | Admitting: Family Medicine

## 2023-03-29 VITALS — BP 121/73 | HR 90 | Ht 67.0 in | Wt 154.0 lb

## 2023-03-29 DIAGNOSIS — M25512 Pain in left shoulder: Secondary | ICD-10-CM | POA: Diagnosis not present

## 2023-03-29 DIAGNOSIS — M25612 Stiffness of left shoulder, not elsewhere classified: Secondary | ICD-10-CM | POA: Diagnosis not present

## 2023-03-29 NOTE — Progress Notes (Signed)
   Acute Office Visit  Subjective:     Patient ID: Bob Wheeler, male    DOB: [birth year], 51 y.o.   MRN: [id number]  Chief Complaint  Patient presents with   Shoulder Pain    HPI Patient is in today for left shoulder pain.  He has had pain for years since about 2009 where he was a Technical sales engineer at [employer] and unfortunately had problems with both shoulders handed up having surgery on both shoulders several months apart and has had a baseline discomfort and pain since then.  But for the last 2 weeks after just raising his finger to point outward he has had much more intense pain in that left shoulder.  He says it did not ease off after a few days which is why he came in today.  He says sometimes it radiates all way down to his fingertips and sometimes the pain is little bit more posterior.  It has been bothersome at night.  He has noticed decreased range of motion and its harder to reach his belt loop in fact has had to switch which side he to wears as well at all because he cannot easily reach back and get it.  At times he is had more of a burning sensation. Has been trying to rest it.   ROS      Objective:    BP 121/73   Pulse 90   Ht 5\' 7"  (1.702 m)   Wt 154 lb (69.9 kg)   SpO2 100%   BMI 24.12 kg/m    Physical Exam  No results found for any visits on 03/29/23.      Assessment & Plan:   Problem List Items Addressed This Visit   None Visit Diagnoses     Acute pain of left shoulder    -  Primary   Decreased ROM of left shoulder          Worsen left shoulder pain over the last 2 weeks not improving now noticing decreased range of motion.  I like to get him in with our sports med doc.  For the short-term can use one of the topical either Voltaren gel or he prefers Tiger balm at times.  He is on amitriptyline.  Ice as needed.  Will try to see if we can get him in either this week or next week.  He has had prior what sounds like extensive surgery on that  shoulder back in 2000 03/2009.  So I do think he will need some additional workup.  No orders of the defined types were placed in this encounter.   Return for schedule with Dr. Sherrine Maples, MD

## 2023-03-29 NOTE — Progress Notes (Signed)
Pt reports that he has been experiencing steady pain in his L shoulder. He feels that he is losing some of his ROM in this

## 2023-03-30 ENCOUNTER — Ambulatory Visit (INDEPENDENT_AMBULATORY_CARE_PROVIDER_SITE_OTHER): Payer: Medicaid Other | Admitting: Sports Medicine

## 2023-03-30 ENCOUNTER — Encounter: Payer: Self-pay | Admitting: Sports Medicine

## 2023-03-30 ENCOUNTER — Ambulatory Visit (INDEPENDENT_AMBULATORY_CARE_PROVIDER_SITE_OTHER): Payer: Medicaid Other

## 2023-03-30 ENCOUNTER — Other Ambulatory Visit (INDEPENDENT_AMBULATORY_CARE_PROVIDER_SITE_OTHER): Payer: Medicaid Other

## 2023-03-30 DIAGNOSIS — M25512 Pain in left shoulder: Secondary | ICD-10-CM

## 2023-03-30 DIAGNOSIS — G8929 Other chronic pain: Secondary | ICD-10-CM | POA: Diagnosis not present

## 2023-03-30 MED ORDER — MELOXICAM 15 MG PO TABS: ORAL_TABLET | ORAL | 3 refills | Status: DC

## 2023-03-30 NOTE — Assessment & Plan Note (Signed)
Pleasant 52 year old male, long history of bilateral shoulder problems, he has had bilateral shoulder arthroscopies, he has had bilateral SLAP tear repairs arthroscopically, rotator cuff debridement. More recently he has had increasing pain left shoulder localized anteriorly. On exam he has a positive speeds test, minimal rotator cuff/impingement signs and minimal labral signs. Due to severity of pain we injected his biceps sheath, it was very difficult to inject medicine into the sheath suggesting some scarring. We will add biceps conditioning, x-rays, return to see me in about 6 weeks, MRI if not better.

## 2023-03-30 NOTE — Progress Notes (Signed)
    Procedures performed today:    Procedure: Real-time Ultrasound Guided injection of the left bicep sheath Device: Samsung HS60  Verbal informed consent obtained.  Time-out conducted.  Noted no overlying erythema, induration, or other signs of local infection.  Skin prepped in a sterile fashion.  Local anesthesia: Topical Ethyl chloride.  With sterile technique and under real time ultrasound guidance: Noted biceps tendon located in the bicipital groove, there was some surrounding scarring, I had a great deal of difficulty injecting medicine into the sheath but we were ultimately able to complete the procedure.  We used a total of 1 cc kenalog 40, 2 cc lidocaine, 2 cc bupivacaine. Completed without difficulty  Advised to call if fevers/chills, erythema, induration, drainage, or persistent bleeding.  Images permanently stored and available for review in PACS.  Impression: Technically successful ultrasound guided injection.  Independent interpretation of notes and tests performed by another provider:   None.  Brief History, Exam, Impression, and Recommendations:    Chronic left shoulder pain Pleasant 52 year old male, long history of bilateral shoulder problems, he has had bilateral shoulder arthroscopies, he has had bilateral SLAP tear repairs arthroscopically, rotator cuff debridement. More recently he has had increasing pain left shoulder localized anteriorly. On exam he has a positive speeds test, minimal rotator cuff/impingement signs and minimal labral signs. Due to severity of pain we injected his biceps sheath, it was very difficult to inject medicine into the sheath suggesting some scarring. We will add biceps conditioning, x-rays, return to see me in about 6 weeks, MRI if not better.    ____________________________________________ Bob Wheeler. Bob Wheeler, M.D., ABFM., CAQSM., AME. Primary Care and Sports Medicine [facility]  Adjunct Professor of  Family Medicine  [employer] [facility] [employer]  Restaurant manager, fast food

## 2023-04-08 ENCOUNTER — Ambulatory Visit (INDEPENDENT_AMBULATORY_CARE_PROVIDER_SITE_OTHER): Payer: Medicaid Other | Admitting: Family Medicine

## 2023-04-08 ENCOUNTER — Encounter: Payer: Self-pay | Admitting: Family Medicine

## 2023-04-08 VITALS — BP 119/79 | HR 98 | Ht 67.0 in | Wt 154.0 lb

## 2023-04-08 DIAGNOSIS — T63441A Toxic effect of venom of bees, accidental (unintentional), initial encounter: Secondary | ICD-10-CM | POA: Diagnosis not present

## 2023-04-08 DIAGNOSIS — T50905A Adverse effect of unspecified drugs, medicaments and biological substances, initial encounter: Secondary | ICD-10-CM | POA: Diagnosis not present

## 2023-04-08 DIAGNOSIS — R21 Rash and other nonspecific skin eruption: Secondary | ICD-10-CM

## 2023-04-08 NOTE — Progress Notes (Signed)
Acute Office Visit  Subjective:     Patient ID: Bob Wheeler, male    DOB: Mar 19, 1971, 52 y.o.   MRN: [id number]  Chief Complaint  Patient presents with   Urticaria    Pt reports that this started on his legs and spread upwards. He wasn't sure if this was a result from when he was stung by a bee or from starting the mobic. He stopped the mobic 2 days ago.     HPI Patient is in today for hives.  Bee stung him on right cheek/jaw line but also started meloxicam  around the same time. Not sure whyat he is reacting to.  Back and inner thighs with rash.  He noticed a few lesions on his lower back today.  The ones on his inner thigh is mostly what started there is a little bit on his lower legs as well.  He has been scratching at them.  He says they are itchy but can burn a little bit too.  No chest pain or shortness of breath.  No lip or tongue swelling.  He has had pretty brisk local reactions to bee stings in the past but has never had any systemic symptoms or hives.  No prior NSAID allergies.  He did stop his mobic 2 days ago.    ROS      Objective:    BP 119/79   Pulse 98   Ht 5\' 7"  (1.702 m)   Wt 154 lb (69.9 kg)   SpO2 99%   BMI 24.12 kg/m    Physical Exam Vitals reviewed.  Constitutional:      Appearance: Normal appearance.  HENT:     Head: Normocephalic.  Pulmonary:     Effort: Pulmonary effort is normal.  Skin:    Comments: Excoriated erythematous 2 to 3 mm maculopapules scattered on the inner thighs and lower legs he has a few lesions on his lower back as well.  Neurological:     Mental Status: He is alert and oriented to person, place, and time.  Psychiatric:        Mood and Affect: Mood normal.        Behavior: Behavior normal.     No results found for any visits on 04/08/23.      Assessment & Plan:   Problem List Items Addressed This Visit   None Visit Diagnoses     Rash    -  Primary   Bee sting, accidental or unintentional, initial encounter        Adverse effect of drug, initial encounter          Rash-most of the areas at this stage are excoriated but seem to be healing.  Keep well moisturized.  Okay to use topical Benadryl cream for itching also happy to send in a steroid cream if needed.  He feels like it is healing getting a little better.  Call if any new or worsening lesions we did discuss warning signs and symptoms for when to go to the emergency room.  I really feel like this could be from the medication versus the actual bee sting.  I am can have him continue to stop the meloxicam he has taken ibuprofen and Aleve in the past without any problem so if he wants to take those he can.  No orders of the defined types were placed in this encounter.   No follow-ups on file.  Nani Gasser, MD

## 2023-05-11 ENCOUNTER — Ambulatory Visit (INDEPENDENT_AMBULATORY_CARE_PROVIDER_SITE_OTHER): Payer: Medicaid Other | Admitting: Sports Medicine

## 2023-05-11 ENCOUNTER — Encounter: Payer: Self-pay | Admitting: Sports Medicine

## 2023-05-11 DIAGNOSIS — G8929 Other chronic pain: Secondary | ICD-10-CM

## 2023-05-11 DIAGNOSIS — M25512 Pain in left shoulder: Secondary | ICD-10-CM

## 2023-05-11 NOTE — Progress Notes (Signed)
    Procedures performed today:    None.  Independent interpretation of notes and tests performed by another provider:   None.  Brief History, Exam, Impression, and Recommendations:    Chronic left shoulder pain Biceps tendinitis, much better after bicipital sheath injection and home PT, he did get some hives several days after his injection, potentially due to the meloxicam. He has not started the meloxicam again, he will let me know if he gets any hives when starting it and I did inform him that widespread hives were typically more of an immune/virally mediated phenomenon. Ultimately he will lean hard on the home physical therapy and we can see him back as needed.    ____________________________________________ Ihor Austin. Benjamin Stain, M.D., ABFM., CAQSM., AME. Primary Care and Sports Medicine [facility]  Adjunct Professor of Family Medicine  [employer] [facility] [employer]  Restaurant manager, fast food

## 2023-05-11 NOTE — Assessment & Plan Note (Addendum)
Biceps tendinitis, much better after bicipital sheath injection and home PT, he did get some hives several days after his injection, potentially due to the meloxicam. He has not started the meloxicam again, he will let me know if he gets any hives when starting it and I did inform him that widespread hives were typically more of an immune/virally mediated phenomenon. Ultimately he will lean hard on the home physical therapy and we can see him back as needed.

## 2023-05-20 DIAGNOSIS — Z419 Encounter for procedure for purposes other than remedying health state, unspecified: Secondary | ICD-10-CM | POA: Diagnosis not present

## 2023-05-24 ENCOUNTER — Encounter: Payer: Self-pay | Admitting: Family Medicine

## 2023-05-24 ENCOUNTER — Ambulatory Visit (INDEPENDENT_AMBULATORY_CARE_PROVIDER_SITE_OTHER): Payer: Medicaid Other | Admitting: Family Medicine

## 2023-05-24 VITALS — BP 124/87 | HR 84 | Ht 67.0 in | Wt 155.0 lb

## 2023-05-24 DIAGNOSIS — K047 Periapical abscess without sinus: Secondary | ICD-10-CM

## 2023-05-24 MED ORDER — AMOXICILLIN-POT CLAVULANATE 875-125 MG PO TABS: 1.0000 | ORAL_TABLET | Freq: Two times a day (BID) | ORAL | 0 refills | Status: DC

## 2023-05-24 NOTE — Progress Notes (Signed)
   Acute Office Visit  Subjective:     Patient ID: Bob Wheeler, male    DOB: [birth year], 52 y.o.   MRN: [id number]  Chief Complaint  Patient presents with   Dental Pain    HPI Patient is in today for dental pain.  He has had some dental issues on his right jaw on the upper and lower area previously.  Lately he is been under a lot more stress that he is noticing he has been also clenching more but now he is gotten some swelling which has been extending into his facial cheek.  He is pretty sure he has a dental infection.  Right now he does not have coverage to go to his current dentist so he is going to have to try to find a new one.  Has been using ice and ibuprofen  ROS      Objective:    BP 124/87   Pulse 84   Ht 5\' 7"  (1.702 m)   Wt 155 lb (70.3 kg)   SpO2 100%   BMI 24.28 kg/m    Physical Exam HENT:     Head:     Comments: Fair dentition.  He has a little bit of swelling around the last molar on the right upper jaw and a little bit of tenderness in that area.  No palpable cervical lymph nodes but he is tender along the lower jawline on the right side.    No results found for any visits on 05/24/23.      Assessment & Plan:   Problem List Items Addressed This Visit   None Visit Diagnoses     Dental abscess    -  Primary      Until infection-will treat with Augmentin for 1 week.  We did discuss that this will temporarily improve his pain and swelling.  Continue with ice and anti-inflammatory but he will need to get in a dentist to definitively address the dental issues.  Meds ordered this encounter  Medications   amoxicillin-clavulanate (AUGMENTIN) 875-125 MG tablet    Sig: Take 1 tablet by mouth 2 (two) times daily.    Dispense:  14 tablet    Refill:  0    No follow-ups on file.  Nani Gasser, MD

## 2023-06-19 DIAGNOSIS — Z419 Encounter for procedure for purposes other than remedying health state, unspecified: Secondary | ICD-10-CM | POA: Diagnosis not present

## 2023-07-20 DIAGNOSIS — Z419 Encounter for procedure for purposes other than remedying health state, unspecified: Secondary | ICD-10-CM | POA: Diagnosis not present

## 2023-08-20 DIAGNOSIS — Z419 Encounter for procedure for purposes other than remedying health state, unspecified: Secondary | ICD-10-CM | POA: Diagnosis not present

## 2023-09-17 DIAGNOSIS — Z419 Encounter for procedure for purposes other than remedying health state, unspecified: Secondary | ICD-10-CM | POA: Diagnosis not present

## 2023-10-29 DIAGNOSIS — Z419 Encounter for procedure for purposes other than remedying health state, unspecified: Secondary | ICD-10-CM | POA: Diagnosis not present

## 2023-11-28 DIAGNOSIS — Z419 Encounter for procedure for purposes other than remedying health state, unspecified: Secondary | ICD-10-CM | POA: Diagnosis not present

## 2023-12-29 DIAGNOSIS — Z419 Encounter for procedure for purposes other than remedying health state, unspecified: Secondary | ICD-10-CM | POA: Diagnosis not present

## 2024-01-28 DIAGNOSIS — Z419 Encounter for procedure for purposes other than remedying health state, unspecified: Secondary | ICD-10-CM | POA: Diagnosis not present

## 2024-01-31 ENCOUNTER — Ambulatory Visit

## 2024-01-31 ENCOUNTER — Ambulatory Visit (INDEPENDENT_AMBULATORY_CARE_PROVIDER_SITE_OTHER): Admitting: Family Medicine

## 2024-01-31 ENCOUNTER — Encounter: Payer: Self-pay | Admitting: Family Medicine

## 2024-01-31 VITALS — BP 126/90 | HR 82 | Ht 67.0 in | Wt 163.0 lb

## 2024-01-31 DIAGNOSIS — M25512 Pain in left shoulder: Secondary | ICD-10-CM | POA: Diagnosis not present

## 2024-01-31 DIAGNOSIS — G8929 Other chronic pain: Secondary | ICD-10-CM | POA: Diagnosis not present

## 2024-01-31 DIAGNOSIS — M25572 Pain in left ankle and joints of left foot: Secondary | ICD-10-CM | POA: Diagnosis not present

## 2024-01-31 DIAGNOSIS — S069X9S Unspecified intracranial injury with loss of consciousness of unspecified duration, sequela: Secondary | ICD-10-CM

## 2024-01-31 MED ORDER — MELOXICAM 15 MG PO TABS: ORAL_TABLET | ORAL | 3 refills | Status: AC

## 2024-01-31 NOTE — Progress Notes (Signed)
   Acute Office Visit  Subjective:     Patient ID: Bob Wheeler, male    DOB: [birth year], 53 y.o.   MRN: [id number]  Chief Complaint  Patient presents with   Ankle Pain   Joint Swelling    HPI Patient is in today for left medial ankle pain and tenderness for medial side no known trauma or injury. Better with ice. Worse with walking.  Waring boot aggravates it.  Resting helps.     His previous Neruologist is out of network.  Will need new referral.  He has not been on some of his medications because of affordability right now.  He is still working on seeing if he can qualify for disability.  ROS      Objective:    BP (!) 126/90   Pulse 82   Ht 5' 7 (1.702 m)   Wt 163 lb (73.9 kg)   SpO2 96%   BMI 25.53 kg/m    Physical Exam Musculoskeletal:     Comments: Tender at base of medial malleolus.  No redness burising or injury. NROM of the ankle.    No swelling.  No results found for any visits on 01/31/24.      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  We discussed checking with his insurance to see if they have any recommendations for in network neurologist so that is covered by his current insurance plan.  He is not actively taking the Keppra or the Maxalt.  He did not feel like his medications were really working that great anyway and so would like to have another opinion as well.  But again we need to start with who is in network.        Other   Chronic left shoulder pain   Relevant Medications   meloxicam  (MOBIC ) 15 MG tablet   Other Visit Diagnoses       Acute left ankle pain    -  Primary   Relevant Orders   DG Ankle Complete Left      Ok to use meloxicam . He still has some at home.  K to continue to ice especially if that does provide some temporary relief.  Will start with a plain film x-ray and call with  results.  Without trauma unclear etiology.  Again he has noticed a little bit of swelling.  Meds ordered this encounter  Medications   meloxicam  (MOBIC ) 15 MG tablet    Sig: One tab PO every 24 hours with a meal for 2 weeks, then once every 24 hours prn pain.    Dispense:  30 tablet    Refill:  3    No follow-ups on file.  Dorothyann Byars, MD

## 2024-01-31 NOTE — Assessment & Plan Note (Addendum)
 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  We discussed checking with his insurance to see if they have any recommendations for in network neurologist so that is covered by his current insurance plan.  He is not actively taking the Keppra or the Maxalt.  He did not feel like his medications were really working that great anyway and so would like to have another opinion as well.  But again we need to start with who is in network.

## 2024-02-03 ENCOUNTER — Ambulatory Visit: Payer: Self-pay | Admitting: Family Medicine

## 2024-02-03 NOTE — Progress Notes (Signed)
 Okay to schedule with Dr. ONEIDA since patient is okay with it.

## 2024-02-03 NOTE — Progress Notes (Signed)
 Call pt: your xray looks okay as far as there is no sign of any time of stress fracture.  Neck step would be I could get you in with our sports med doc to see what might else be going on with the soft tissue or we could even consider referral to PT.

## 2024-02-03 NOTE — Telephone Encounter (Signed)
 Scheduled

## 2024-02-08 ENCOUNTER — Encounter: Payer: Self-pay | Admitting: Sports Medicine

## 2024-02-08 ENCOUNTER — Ambulatory Visit (INDEPENDENT_AMBULATORY_CARE_PROVIDER_SITE_OTHER): Admitting: Sports Medicine

## 2024-02-08 DIAGNOSIS — M25572 Pain in left ankle and joints of left foot: Secondary | ICD-10-CM

## 2024-02-08 DIAGNOSIS — G8929 Other chronic pain: Secondary | ICD-10-CM | POA: Diagnosis not present

## 2024-02-08 MED ORDER — CALCIUM 600+D PLUS MINERALS 600-400 MG-UNIT PO TABS: ORAL_TABLET | ORAL | 3 refills | Status: AC

## 2024-02-08 NOTE — Assessment & Plan Note (Addendum)
 Pleasant 53 year old male, he has had over a month of pain left ankle medial aspect directly over the medial malleolus. No trauma. X-rays were done that were unrevealing. On exam he has tenderness directly over the tibia, he also has a bit of tenderness at the navicular. No tenderness over the tibialis posterior, and no pain with resisted inversion. This pattern suggests a distal tibial stress injury. Would like an MRI and I would like him in a cam boot with a calcium  and vitamin D supplement until we make a definitive diagnosis. If stress injury, expect 8 to 12 weeks for healing.  For insurance coverage purposes the patient has failed 6 weeks of physician directed conservative treatment, physical therapy in the form of range of motion exercises, strengthening, isometrics and isotonics, he did the exercises 3 times daily, 10 minutes at a time.

## 2024-02-08 NOTE — Progress Notes (Signed)
    Procedures performed today:    None.  Independent interpretation of notes and tests performed by another provider:   None.  Brief History, Exam, Impression, and Recommendations:    Left ankle pain Pleasant 53 year old male, he has had over a month of pain left ankle medial aspect directly over the medial malleolus. No trauma. X-rays were done that were unrevealing. On exam he has tenderness directly over the tibia, he also has a bit of tenderness at the navicular. No tenderness over the tibialis posterior, and no pain with resisted inversion. This pattern suggests a distal tibial stress injury. Would like an MRI and I would like him in a cam boot with a calcium  and vitamin D supplement until we make a definitive diagnosis. If stress injury, expect 8 to 12 weeks for healing.  For insurance coverage purposes the patient has failed 6 weeks of physician directed conservative treatment, physical therapy in the form of range of motion exercises, strengthening, isometrics and isotonics, he did the exercises 3 times daily, 10 minutes at a time.    ____________________________________________ Debby PARAS. Curtis, M.D., ABFM., CAQSM., AME. Primary Care and Sports Medicine [facility]  Adjunct Professor of [facility] Medicine  [employer]  Restaurant manager, [employer]

## 2024-02-28 DIAGNOSIS — Z419 Encounter for procedure for purposes other than remedying health state, unspecified: Secondary | ICD-10-CM | POA: Diagnosis not present

## 2024-03-21 ENCOUNTER — Ambulatory Visit: Admitting: Sports Medicine

## 2024-03-22 ENCOUNTER — Encounter: Payer: Self-pay | Admitting: Sports Medicine

## 2024-03-30 DIAGNOSIS — Z419 Encounter for procedure for purposes other than remedying health state, unspecified: Secondary | ICD-10-CM | POA: Diagnosis not present

## 2024-05-03 ENCOUNTER — Telehealth: Payer: Self-pay

## 2024-05-03 NOTE — Telephone Encounter (Signed)
 I called and left a message for a return call for colon cancer screening. Never had a colonoscopy or Cologuard.

## 2024-05-30 DIAGNOSIS — Z419 Encounter for procedure for purposes other than remedying health state, unspecified: Secondary | ICD-10-CM | POA: Diagnosis not present

## 2024-06-29 DIAGNOSIS — Z419 Encounter for procedure for purposes other than remedying health state, unspecified: Secondary | ICD-10-CM | POA: Diagnosis not present
